# Patient Record
Sex: Female | Born: 1990
Health system: Southern US, Community
[De-identification: ages and names within clinical notes are randomized; demographics above are authoritative.]

## PROBLEM LIST (undated history)

## (undated) DIAGNOSIS — Z789 Other specified health status: Secondary | ICD-10-CM

## (undated) HISTORY — DX: Other specified health status: Z78.9

## (undated) HISTORY — PX: WISDOM TOOTH EXTRACTION: SHX21

## (undated) HISTORY — PX: NO PAST SURGERIES: SHX2092

---
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
## 2011-02-16 LAB — CBC
HCT: 29.7 % — ABNORMAL LOW (ref 36.0–46.0)
Hemoglobin: 10 g/dL — ABNORMAL LOW (ref 12.0–15.0)
MCHC: 33.6 g/dL (ref 30.0–36.0)
MCV: 88 fL (ref 78.0–100.0)
RDW: 13.7 % (ref 11.5–15.5)

## 2014-03-05 ENCOUNTER — Emergency Department (HOSPITAL_COMMUNITY)
Admission: EM | Admit: 2014-03-05 | Discharge: 2014-03-05 | Disposition: A | Discharge status: Home / Self Care | Payer: BC Managed Care – PPO | Source: Home / Self Care | Attending: Family Medicine | Admitting: Family Medicine

## 2014-03-05 ENCOUNTER — Encounter (HOSPITAL_COMMUNITY): Payer: Self-pay | Admitting: Emergency Medicine

## 2014-03-05 DIAGNOSIS — I498 Other specified cardiac arrhythmias: Secondary | ICD-10-CM

## 2014-03-05 DIAGNOSIS — R21 Rash and other nonspecific skin eruption: Secondary | ICD-10-CM

## 2014-03-05 DIAGNOSIS — I491 Atrial premature depolarization: Secondary | ICD-10-CM

## 2014-03-05 LAB — POCT I-STAT, CHEM 8
BUN: 8 mg/dL (ref 6–23)
CREATININE: 0.8 mg/dL (ref 0.50–1.10)
Calcium, Ion: 1.19 mmol/L (ref 1.12–1.23)
Chloride: 106 mEq/L (ref 96–112)
Glucose, Bld: 93 mg/dL (ref 70–99)
HEMATOCRIT: 44 % (ref 36.0–46.0)
Hemoglobin: 15 g/dL (ref 12.0–15.0)
Potassium: 3.9 mEq/L (ref 3.7–5.3)
SODIUM: 141 meq/L (ref 137–147)
TCO2: 23 mmol/L (ref 0–100)

## 2014-03-05 LAB — SEDIMENTATION RATE: SED RATE: 32 mm/h — AB (ref 0–22)

## 2014-03-05 LAB — RPR: RPR: NONREACTIVE

## 2014-03-05 LAB — HIV ANTIBODY (ROUTINE TESTING W REFLEX): HIV: NONREACTIVE

## 2014-03-05 MED ORDER — PREDNISONE 10 MG PO KIT: PACK | ORAL | Status: DC

## 2014-03-05 NOTE — Discharge Instructions (Signed)

## 2014-03-05 NOTE — ED Notes (Signed)
C/o rash on arms, legs, and chest x 3 weeks.  Rash comes and goes.  Irritation.  Pt has tried Claritin, benadryl with no relief.  Denies any changes in soaps,detergents, and no new medications.

## 2014-03-05 NOTE — ED Provider Notes (Signed)
CSN: [id number]     Arrival date & time 03/05/14  0802 History   First MD Initiated Contact with Patient 03/05/14 [phone]     Chief Complaint  Patient presents with  . Rash   (Consider location/radiation/quality/duration/timing/severity/associated sxs/prior Treatment) HPI Comments: 23 year old female presents complaining of a rash on her arms, chest, cheeks, lower back, and her palms intermittently for about 3 weeks. The rash comes and goes. It is itchy and irritated. Last night, she also started to have some very mild chest pressure. The pressure is worse with laying flat and gets better with activity. She also admits to some mild fatigue over the past couple of weeks. She has no other associated symptoms. She denies pain. Nonradiating. No significant personal or family medical history. She has no history of allergic reactions. Denies any changes in soaps, detergents, clothing. No new medications. No history of genital lesions. She was tested for HIV and syphilis about a year ago, this was negative. No history of tick bites. No fever. She has been taking Claritin and Benadryl with no relief.  Patient is a 23 y.o. female presenting with rash.  Rash Associated symptoms: fatigue   Associated symptoms: no abdominal pain, no fever, no joint pain, no myalgias, no nausea, no shortness of breath, no sore throat and not vomiting     History reviewed. No pertinent past medical history. History reviewed. No pertinent past surgical history. History reviewed. No pertinent family history. History  Substance Use Topics  . Smoking status: Never Smoker   . Smokeless tobacco: Not on file  . Alcohol Use: Yes   OB History   Grav Para Term Preterm Abortions TAB SAB Ect Mult Living                 Review of Systems  Constitutional: Positive for fatigue. Negative for fever and chills.  HENT: Negative for sinus pressure and sore throat.   Eyes: Negative for visual disturbance.  Respiratory: Positive for chest  tightness. Negative for cough and shortness of breath.   Cardiovascular: Negative for chest pain, palpitations and leg swelling.  Gastrointestinal: Negative for nausea, vomiting and abdominal pain.  Endocrine: Negative for polydipsia and polyuria.  Genitourinary: Negative for dysuria, urgency and frequency.  Musculoskeletal: Negative for arthralgias and myalgias.  Skin: Positive for rash.  Neurological: Negative for dizziness, weakness and light-headedness.    Allergies  Review of patient's allergies indicates no known allergies.  Home Medications   Current Outpatient Rx  Name  Route  Sig  Dispense  Refill  . PredniSONE 10 MG KIT      12 day taper dose pack. Use as directed   48 each   0    BP 135/79  Pulse 102  Temp(Src) 98.6 F (37 C) (Oral)  Resp 16  SpO2 99% Physical Exam  Nursing note and vitals reviewed. Constitutional: She is oriented to person, place, and time. Vital signs are normal. She appears well-developed and well-nourished. No distress.  HENT:  Head: Normocephalic and atraumatic.  Mouth/Throat: Oropharynx is clear and moist.  Eyes: Conjunctivae are normal. Right eye exhibits no discharge. Left eye exhibits no discharge.  Neck: Normal range of motion.  Cardiovascular: Normal rate, regular rhythm and normal heart sounds.  Exam reveals no gallop and no friction rub.   No murmur heard. Pulmonary/Chest: Effort normal and breath sounds normal. No respiratory distress. She has no wheezes. She has no rales.  Lymphadenopathy:       Head (right side): No submandibular and  no tonsillar adenopathy present.       Head (left side): No submandibular and no tonsillar adenopathy present.    She has no cervical adenopathy.    She has no axillary adenopathy.       Right: No supraclavicular adenopathy present.       Left: No supraclavicular adenopathy present.  Neurological: She is alert and oriented to person, place, and time. She has normal strength. Coordination normal.   Skin: Skin is warm and dry. Rash noted. Rash is maculopapular (erythematous, urticarial/maculopapular rash involving arms, palms, and upper chest ) and urticarial. She is not diaphoretic.  Psychiatric: She has a normal mood and affect. Judgment normal.    ED Course  Procedures (including critical care time) Labs Review Labs Reviewed  SEDIMENTATION RATE - Abnormal; Notable for the following:    Sed Rate 32 (*)    All other components within normal limits  RPR  HIV ANTIBODY (ROUTINE TESTING)  POCT I-STAT, CHEM 8   Imaging Review No results found.  EKG: p-wave inversion in II, III, aVF, indicated ectopic atrial rhythm   MDM   1. Rash   2. Ectopic atrial rhythm    Will treat rash with sterapred, continue antihistamine PRN.  Referred to derm.  Sent ESR, RPR, HIV, they are pending   Labs revealed only mild elevation in ESR, otherwise normal.  She will f/u with derm  Spoke with cardiologist Dr. Stanford Breed who also looked at patient's EKG, Dr. Stanford Breed interpreted this as ectopic atrial rhythm, nothing to worry about.  He did not recommend outpatient cardiology follow up.     Meds ordered this encounter  Medications  . PredniSONE 10 MG KIT    Sig: 12 day taper dose pack. Use as directed    Dispense:  48 each    Refill:  0    Order Specific Question:  Supervising Provider    Answer:  Ihor Gully D [place], PA-C 03/06/14 1939

## 2014-03-06 NOTE — ED Notes (Addendum)
Sed rate 32 H, HIV/RPR non-reactive.  Message sent to Z. Baker PA. [name], Lindsey Calderon 03/08/2014 He wrote essentially normal.  No further action needed. 03/09/2014

## 2014-03-08 NOTE — ED Provider Notes (Signed)
Medical screening examination/treatment/procedure(s) were performed by resident physician or non-physician practitioner and as supervising physician I was immediately available for consultation/collaboration.   Jaide Hillenburg DOUGLAS MD.   Olis Viverette D Kayra Crowell, MD 03/08/14 2118 

## 2014-04-09 ENCOUNTER — Ambulatory Visit: Payer: BC Managed Care – PPO | Admitting: Family Medicine

## 2014-04-27 ENCOUNTER — Encounter: Payer: Self-pay | Admitting: Family Medicine

## 2014-04-27 ENCOUNTER — Ambulatory Visit (INDEPENDENT_AMBULATORY_CARE_PROVIDER_SITE_OTHER): Payer: BC Managed Care – PPO | Admitting: Family Medicine

## 2014-04-27 VITALS — BP 122/62 | HR 84 | Temp 99.1°F | Wt 150.0 lb

## 2014-04-27 DIAGNOSIS — R21 Rash and other nonspecific skin eruption: Secondary | ICD-10-CM | POA: Insufficient documentation

## 2014-04-27 DIAGNOSIS — Z7189 Other specified counseling: Secondary | ICD-10-CM

## 2014-04-27 DIAGNOSIS — Z7689 Persons encountering health services in other specified circumstances: Secondary | ICD-10-CM

## 2014-04-27 NOTE — Progress Notes (Signed)
Pre visit review using our clinic review tool, if applicable. No additional management support is needed unless otherwise documented below in the visit note. 

## 2014-04-27 NOTE — Patient Instructions (Signed)
-  please let us know what your dermatologist tells you about your skin issues  -please schedule physical exam with pap at your convenience  -PLEASE SIGN UP FOR MYCHART TODAY   We recommend the following healthy lifestyle measures: - eat a healthy diet consisting of lots of vegetables, fruits, beans, nuts, seeds, healthy meats such as white chicken and fish and whole grains.  - avoid fried foods, fast food, processed foods, sodas, red meet and other fattening foods.  - get a least 150 minutes of aerobic exercise per week.   Follow up in: at your convenience - schedule [facility]/physical with [facility] or here

## 2014-04-27 NOTE — Progress Notes (Signed)
No chief complaint on file.   HPI:  Lindsey Calderon is here to establish care.  [facility] and [facility] - last pap in the last 3 years and normal per her report.  Has the following chronic problems and concerns today:  Patient Active Problem List   Diagnosis Date Noted  . Skin rash - followed by Dr. Dorita Sciara office 04/27/2014   Skin Issues: -followed by Dr. Dorita Sciara office - on doxycycline  Health Maintenance:  ROS: See pertinent positives and negatives per HPI.  History reviewed. No pertinent past medical history.  Family History  Problem Relation Age of Onset  . Hypertension Mother   . Thyroid disease Mother   . Cancer Mother     throat cancer  . Hypertension Father     History   Social History  . Marital Status: Single    Spouse Name: N/A    Number of Children: N/A  . Years of Education: N/A   Social History Main Topics  . Smoking status: Never Smoker   . Smokeless tobacco: None  . Alcohol Use: Yes     Comment: 1 drink occ  . Drug Use: No  . Sexual Activity: Yes    Birth Control/ Protection: Injection   Other Topics Concern  . None   Social History Narrative   Work or School: ER tech - [employer]      Home Situation: lives with boyfriend 42 yo son (2015)      Spiritual Beliefs: Christian      Lifestyle: walking 3 days per week; diet is fair             Current outpatient prescriptions:DOXYCYCLINE HYCLATE PO, Take 100 mg by mouth 2 (two) times daily., Disp: , Rfl:   EXAM:  Filed Vitals:   04/27/14 1107  BP: 122/62  Pulse: 103  Temp: 99.1 F (37.3 C)    There is no height on file to calculate BMI.  GENERAL: vitals reviewed and listed above, alert, oriented, appears well hydrated and in no acute distress  HEENT: atraumatic, conjunttiva clear, no obvious abnormalities on inspection of external nose and ears  NECK: no obvious masses on inspection  LUNGS: clear to auscultation bilaterally, no wheezes, rales or  rhonchi, good air movement  CV: HRRR, no peripheral edema  MS: moves all extremities without noticeable abnormality  PSYCH: pleasant and cooperative, no obvious depression or anxiety  ASSESSMENT AND PLAN:  Discussed the following assessment and plan:  Skin rash - followed by Dr. Dorita Sciara office  Encounter to establish care  -We reviewed the PMH, PSH, FH, SH, Meds and Allergies. -We provided refills for any medications we will prescribe as needed. -We addressed current concerns per orders and patient instructions. -We have asked for records for pertinent exams, studies, vaccines and notes from previous providers. -We have advised patient to follow up per instructions below. -she may [facility] here [facility] - she is going to decide   -Patient advised to return or notify a doctor immediately if symptoms worsen or persist or new concerns arise.  Patient Instructions  -please let us know what your dermatologist tells you about your skin issues  -please schedule physical exam with pap at your convenience  -PLEASE SIGN UP FOR MYCHART TODAY   We recommend the following healthy lifestyle measures: - eat a healthy diet consisting of lots of vegetables, fruits, beans, nuts, seeds, healthy meats such as white chicken and fish and whole  grains.  - avoid fried foods, fast food, processed foods, sodas, red meet and other fattening foods.  - get a least 150 minutes of aerobic exercise per week.   Follow up in: at your convenience - schedule pap/physical with [facility] or here        Terressa KoyanagiHannah R. Gedalya Jim

## 2014-04-30 ENCOUNTER — Ambulatory Visit: Payer: BC Managed Care – PPO | Admitting: Family Medicine

## 2016-07-31 ENCOUNTER — Encounter: Payer: BC Managed Care – PPO | Admitting: Family Medicine

## 2016-08-10 ENCOUNTER — Encounter: Payer: Self-pay | Admitting: Family Medicine

## 2016-08-10 ENCOUNTER — Other Ambulatory Visit (HOSPITAL_COMMUNITY)
Admission: RE | Admit: 2016-08-10 | Discharge: 2016-08-10 | Disposition: A | Discharge status: Home / Self Care | Payer: BC Managed Care – PPO | Source: Ambulatory Visit | Attending: Family Medicine | Admitting: Family Medicine

## 2016-08-10 ENCOUNTER — Ambulatory Visit (INDEPENDENT_AMBULATORY_CARE_PROVIDER_SITE_OTHER): Payer: BC Managed Care – PPO | Admitting: Family Medicine

## 2016-08-10 VITALS — BP 108/60 | HR 100 | Temp 99.6°F | Ht 64.5 in | Wt 163.0 lb

## 2016-08-10 DIAGNOSIS — Z01419 Encounter for gynecological examination (general) (routine) without abnormal findings: Secondary | ICD-10-CM | POA: Insufficient documentation

## 2016-08-10 DIAGNOSIS — Z23 Encounter for immunization: Secondary | ICD-10-CM

## 2016-08-10 DIAGNOSIS — Z111 Encounter for screening for respiratory tuberculosis: Secondary | ICD-10-CM | POA: Diagnosis not present

## 2016-08-10 DIAGNOSIS — Z0000 Encounter for general adult medical examination without abnormal findings: Secondary | ICD-10-CM | POA: Diagnosis not present

## 2016-08-10 DIAGNOSIS — Z113 Encounter for screening for infections with a predominantly sexual mode of transmission: Secondary | ICD-10-CM | POA: Diagnosis present

## 2016-08-10 DIAGNOSIS — N760 Acute vaginitis: Secondary | ICD-10-CM | POA: Insufficient documentation

## 2016-08-10 DIAGNOSIS — Z124 Encounter for screening for malignant neoplasm of cervix: Secondary | ICD-10-CM | POA: Diagnosis not present

## 2016-08-10 LAB — CBC
HCT: 36.4 % (ref 36.0–46.0)
HEMOGLOBIN: 12.5 g/dL (ref 12.0–15.0)
MCHC: 34.4 g/dL (ref 30.0–36.0)
MCV: 90.1 fl (ref 78.0–100.0)
PLATELETS: 212 10*3/uL (ref 150.0–400.0)
RBC: 4.04 Mil/uL (ref 3.87–5.11)
RDW: 13.9 % (ref 11.5–15.5)
WBC: 6.7 10*3/uL (ref 4.0–10.5)

## 2016-08-10 LAB — HEMOGLOBIN A1C: Hgb A1c MFr Bld: 5.7 % (ref 4.6–6.5)

## 2016-08-10 LAB — TSH: TSH: 0.71 u[IU]/mL (ref 0.35–4.50)

## 2016-08-10 NOTE — Progress Notes (Signed)
Pre visit review using our clinic review tool, if applicable. No additional management support is needed unless otherwise documented below in the visit note. 

## 2016-08-10 NOTE — Progress Notes (Signed)
HPI:  Here for CPE:  -Concerns and/or follow up today:  Requests completion form for [employer] info and Tech - no sports. Unfortunately mother passed several months ago after long battle with thyroid cancer. She feels she is coping ok now and does not feel needs help. Wants basic physical and labs due to this FH.  -Diet: variety of foods, balance and well rounded, larger portion sizes  -Exercise: no regular exercise  -Taking folic acid, vitamin D or calcium: yes  -Diabetes and Dyslipidemia Screening: not fasting.  -Hx of HTN: no  -Vaccines: UTD except flu and uncertain of last Tdap booster  -pap history: thinks last pap in 2010 and normal  -FDLMP: 07/09/16, reports periods irr since stopping depo  -sexual activity: yes, female partner, no new partners  -wants STI testing (Hep C if born [birth year]): want HIV and STI testing, though denies concerns or high risk behaviors  -FH breast, colon or ovarian ca: see FH Last mammogram: n/a Last colon cancer screening: n/a  -Alcohol, Tobacco, drug use: see social history  Review of Systems - no fevers, unintentional weight loss, vision loss, hearing loss, chest pain, sob, hemoptysis, melena, hematochezia, hematuria, genital discharge, changing or concerning skin lesions, bleeding, bruising, loc, thoughts of self harm or SI  No past medical history on file.  Past Surgical History:  Procedure Laterality Date  . WISDOM TOOTH EXTRACTION      Family History  Problem Relation Age of Onset  . Hypertension Mother   . Thyroid disease Mother   . Cancer Mother     throat cancer  . Hypertension Father     Social History   Social History  . Marital status: Single    Spouse name: N/A  . Number of children: N/A  . Years of education: N/A   Social History Main Topics  . Smoking status: Never Smoker  . Smokeless tobacco: None  . Alcohol use Yes     Comment: 1 drink occ  . Drug use: No  . Sexual activity: Yes    Birth control/  protection: Injection   Other Topics Concern  . None   Social History Narrative   Work or School: ER tech - [employer]      Home Situation: lives with boyfriend 693 yo son (2015)      Spiritual Beliefs: Christian      Lifestyle: walking 3 days per week; diet is fair             No current outpatient prescriptions on file.  EXAM:  Vitals:   08/10/16 1316  BP: 108/60  Pulse: 100  Temp: 99.6 F (37.6 C)  Body mass index is 27.55 kg/m.  GENERAL: vitals reviewed and listed below, alert, oriented, appears well hydrated and in no acute distress  HEENT: head atraumatic, PERRLA, normal appearance of eyes, ears, nose and mouth. moist mucus membranes.  NECK: supple, no masses or lymphadenopathy  LUNGS: clear to auscultation bilaterally, no rales, rhonchi or wheeze  CV: HRRR, no peripheral edema or cyanosis, normal pedal pulses  BREAST: normal appearance - no lesions or discharge, on palpation normal breast tissue without any suspicious masses  ABDOMEN: bowel sounds normal, soft, non tender to palpation, no masses, no rebound or guarding  GU: normal appearance of external genitalia - no lesions or masses, normal vaginal mucosa - no abnormal discharge, normal appearance of cervix - no lesions or abnormal discharge, no masses or tenderness on palpation of uterus and ovaries.  RECTAL: refused  SKIN:  no rash or abnormal lesions  MS: normal gait, moves all extremities normally  NEURO: normal gait, speech and thought processing grossly intact, muscle tone grossly intact throughout  PSYCH: normal affect, pleasant and cooperative  ASSESSMENT AND PLAN:  Discussed the following assessment and plan:  Encounter for preventive health examination - Plan: TSH, HIV antibody (with reflex), CBC (no diff), Hemoglobin A1c  Screening-pulmonary TB - Plan: Quantiferon tb gold assay (blood)  Cervical cancer screening - Plan: PAP [[place]]  Need for Tdap vaccination - Plan: Tdap vaccine  greater than or equal to 7yo IM  Encounter for immunization - Plan: Flu Vaccine QUAD 36+ mos IM   -Discussed and advised all [place] preventive services health task force level A and B recommendations for age, sex and risks.  -Advised at least 150 minutes of exercise per week and a healthy diet with avoidance of (less then 1 serving per week) processed foods, white starches, red meat, fast foods and sweets and consisting of: * 5-9 servings of fresh fruits and vegetables (not corn or potatoes) *nuts and seeds, beans *olives and olive oil *lean meats such as fish and white chicken  *whole grains  -labs, studies and vaccines per orders this encounter  -form given to assistant to finish once quant gold test back; advised assistant to contact patient when data complete.  Orders Placed This Encounter  Procedures  . Tdap vaccine greater than or equal to 7yo IM  . Flu Vaccine QUAD 36+ mos IM  . TSH  . HIV antibody (with reflex)  . CBC (no diff)  . Hemoglobin A1c  . Quantiferon tb gold assay (blood)    Patient advised to return to clinic immediately if symptoms worsen or persist or new concerns.  Patient Instructions  BEFORE YOU LEAVE: -follow up: yearly and as needed -flu and Tdap -hearing screen -lab  Once we get you tb blood test back, Ronnald Collum will complete the form and let you know that it is ready.  We have ordered labs or studies at this visit. It can take up to 1-2 weeks for results and processing. IF results require follow up or explanation, we will call you with instructions. Clinically stable results will be released to your [facility]. If you have not heard from [place] or cannot find your results in [facility] in 2 weeks please contact our office at [phone].  If you are not yet signed up for [facility], please consider signing up.   We recommend the following healthy lifestyle for LIFE: 1) Small portions.   Tip: eat off of a salad plate instead of a dinner plate.  Tip: It is ok to  feel hungry after a meal - that likely means you ate an appropriate portion.  Tip: if you need more or a snack choose fruits, veggies and/or a handful of nuts or seeds.  2) Eat a healthy clean diet.  * Tip: Avoid (less then 1 serving per week): processed foods, sweets, sweetened drinks, white starches (rice, flour, bread, potatoes, pasta, etc), red meat, fast foods, butter  *Tip: CHOOSE instead   * 5-9 servings per day of fresh or frozen fruits and vegetables (but not corn, potatoes, bananas, canned or dried fruit)   *nuts and seeds, beans   *olives and olive oil   *small portions of lean meats such as fish and white chicken    *small portions of whole grains  3)Get at least 150 minutes of sweaty aerobic exercise per week.  4)Reduce stress - consider  counseling, meditation and relaxation to balance other aspects of your life.            No Follow-up on file.  Kriste Basque R., DO

## 2016-08-10 NOTE — Patient Instructions (Signed)
BEFORE YOU LEAVE: -follow up: yearly and as needed -flu and Tdap -hearing screen -lab  Once we get you tb blood test back, Ronnald CollumJo Anne will complete the form and let you know that it is ready.  We have ordered labs or studies at this visit. It can take up to 1-2 weeks for results and processing. IF results require follow up or explanation, we will call you with instructions. Clinically stable results will be released to your [facility]. If you have not heard from us or cannot find your results in [facility] in 2 weeks please contact our office at [phone].  If you are not yet signed up for [facility], please consider signing up.   We recommend the following healthy lifestyle for LIFE: 1) Small portions.   Tip: eat off of a salad plate instead of a dinner plate.  Tip: It is ok to feel hungry after a meal - that likely means you ate an appropriate portion.  Tip: if you need more or a snack choose fruits, veggies and/or a handful of nuts or seeds.  2) Eat a healthy clean diet.  * Tip: Avoid (less then 1 serving per week): processed foods, sweets, sweetened drinks, white starches (rice, flour, bread, potatoes, pasta, etc), red meat, fast foods, butter  *Tip: CHOOSE instead   * 5-9 servings per day of fresh or frozen fruits and vegetables (but not corn, potatoes, bananas, canned or dried fruit)   *nuts and seeds, beans   *olives and olive oil   *small portions of lean meats such as fish and white chicken    *small portions of whole grains  3)Get at least 150 minutes of sweaty aerobic exercise per week.  4)Reduce stress - consider counseling, meditation and relaxation to balance other aspects of your life.

## 2016-08-11 LAB — HIV ANTIBODY (ROUTINE TESTING W REFLEX): HIV 1&2 Ab, 4th Generation: NONREACTIVE

## 2016-08-13 LAB — QUANTIFERON TB GOLD ASSAY (BLOOD)
Interferon Gamma Release Assay: NEGATIVE
Mitogen-Nil: >10 IU/mL
Quantiferon Nil Value: 0.04 IU/mL

## 2016-08-14 LAB — CYTOLOGY - PAP

## 2016-08-15 LAB — CERVICOVAGINAL ANCILLARY ONLY: Candida vaginitis: NEGATIVE

## 2016-08-16 MED ORDER — METRONIDAZOLE 500 MG PO TABS: 500.0000 mg | ORAL_TABLET | Freq: Two times a day (BID) | ORAL | 0 refills | Status: DC

## 2016-08-16 NOTE — Addendum Note (Signed)
Addended by: Johnella MoloneyFUNDERBURK, Jamisha Hoeschen A on: 08/16/2016 05:07 PM   Modules accepted: Orders

## 2017-08-22 ENCOUNTER — Encounter: Payer: Self-pay | Admitting: Family Medicine

## 2017-10-07 ENCOUNTER — Encounter: Payer: Self-pay | Admitting: Family Medicine

## 2017-10-07 NOTE — Progress Notes (Signed)
HPI:  Here for CPE:  -Concerns and/or follow up today:   R ear clogged, no pain, sinus congestion, fevers, drainage.  -Diet: variety of foods, balance and well rounded, larger portion sizes - sweets is her weakness  -Exercise: no regular exercise  -Taking folic acid, vitamin D or calcium: no  -Diabetes and Dyslipidemia Screening: fasting for labs  -Hx of HTN: no  -Vaccines: UTD  -pap history: 08/2016  -FDLMP: see nursing notes  -sexual activity: yes, female partner, no new partners  -wants STI testing (Hep C if born [birth year]): no  -FH breast, colon or ovarian ca: see FH Last mammogram:n/a Last colon cancer screening:n/a   -Alcohol, Tobacco, drug use: see social history  Review of Systems - no fevers, unintentional weight loss, vision loss, hearing loss, chest pain, sob, hemoptysis, melena, hematochezia, hematuria, genital discharge, changing or concerning skin lesions, bleeding, bruising, loc, thoughts of self harm or SI  No past medical history on file.  Past Surgical History:  Procedure Laterality Date  . WISDOM TOOTH EXTRACTION      Family History  Problem Relation Age of Onset  . Hypertension Mother   . Thyroid disease Mother   . Cancer Mother        throat cancer  . Hypertension Father     Social History   Socioeconomic History  . Marital status: Single    Spouse name: None  . Number of children: None  . Years of education: None  . Highest education level: None  Social Needs  . Financial resource strain: None  . Food insecurity - worry: None  . Food insecurity - inability: None  . Transportation needs - medical: None  . Transportation needs - non-medical: None  Occupational History  . None  Tobacco Use  . Smoking status: Never Smoker  . Smokeless tobacco: Never Used  Substance and Sexual Activity  . Alcohol use: Yes    Comment: 1 drink occ  . Drug use: No  . Sexual activity: Yes    Birth control/protection: Injection  Other Topics  Concern  . None  Social History Narrative   Work or School: [employer] Situation: lives with boyfriend 25 yo son (2015)      Spiritual Beliefs: Christian      Lifestyle: walking 3 days per week; diet is fair              Current Outpatient Medications:  .  metroNIDAZOLE (FLAGYL) 500 MG tablet, Take 1 tablet (500 mg total) by mouth 2 (two) times daily., Disp: 14 tablet, Rfl: 0  EXAM:  Vitals:   10/08/17 0756  BP: 108/70  Pulse: 94  Temp: 99.2 F (37.3 C)   Body mass index is 32.65 kg/m.  GENERAL: vitals reviewed and listed below, alert, oriented, appears well hydrated and in no acute distress  HEENT: head atraumatic, PERRLA, normal appearance of eyes, ears, nose and mouth. moist mucus membranes. Soft cerumen in R ear canal.  NECK: supple, no masses or lymphadenopathy  LUNGS: clear to auscultation bilaterally, no rales, rhonchi or wheeze  CV: HRRR, no peripheral edema or cyanosis, normal pedal pulses  ABDOMEN: bowel sounds normal, soft, non tender to palpation, no masses, no rebound or guarding  GU/BREAST: declined  SKIN: no rash or abnormal lesions  MS: normal gait, moves all extremities normally  NEURO: normal gait, speech and thought processing grossly intact, muscle tone grossly intact throughout  PSYCH: normal affect, pleasant and cooperative  ASSESSMENT AND PLAN:  Discussed the following assessment and plan:  Visit for preventive health examination  Screening for depression  BMI 32.0-32.9,adult - Plan: Hemoglobin A1c, Cholesterol, total, HDL cholesterol  Impacted cerumen of right ear   -Discussed and advised all [place] preventive services health task force level A and B recommendations for age, sex and risks.  -Advised at least 150 minutes of exercise per week and a healthy diet with avoidance of (less then 1 serving per week) processed foods, white starches, red meat, fast foods and sweets and consisting of: * 5-9 servings of  fresh fruits and vegetables (not corn or potatoes) *nuts and seeds, beans *olives and olive oil *lean meats such as fish and white chicken  *whole grains  -labs, studies and vaccines per orders this encounter  -removed cerumen for R ear canal with soft curette, tolerated well.  Orders Placed This Encounter  Procedures  . Hemoglobin A1c  . Cholesterol, total  . HDL cholesterol    Patient advised to return to clinic immediately if symptoms worsen or persist or new concerns.  Patient Instructions  BEFORE YOU LEAVE: -labs -follow up: yearly  We have ordered labs or studies at this visit. It can take up to 1-2 weeks for results and processing. IF results require follow up or explanation, we will call you with instructions. Clinically stable results will be released to your [facility]. If you have not heard from [place] or cannot find your results in [facility] in 2 weeks please contact our office at [phone].  If you are not yet signed up for [facility], please consider signing up.  Health Maintenance, Female Adopting a healthy lifestyle and getting preventive care can go a long way to promote health and wellness. Talk with your health care provider about what schedule of regular examinations is right for you. This is a good chance for you to check in with your provider about disease prevention and staying healthy. In between checkups, there are plenty of things you can do on your own. Experts have done a lot of research about which lifestyle changes and preventive measures are most likely to keep you healthy. Ask your health care provider for more information. Weight and diet Eat a healthy diet  Be sure to include plenty of vegetables, fruits, low-fat dairy products, and lean protein.  Do not eat a lot of foods high in solid fats, added sugars, or salt.  Get regular exercise. This is one of the most important things you can do for your health. ? Most adults should exercise for at least 150  minutes each week. The exercise should increase your heart rate and make you sweat (moderate-intensity exercise). ? Most adults should also do strengthening exercises at least twice a week. This is in addition to the moderate-intensity exercise.  Maintain a healthy weight  Body mass index (BMI) is a measurement that can be used to identify possible weight problems. It estimates body fat based on height and weight. Your health care provider can help determine your BMI and help you achieve or maintain a healthy weight.  For females 47 years of age and older: ? A BMI below 18.5 is considered underweight. ? A BMI of 18.5 to 24.9 is normal. ? A BMI of 25 to 29.9 is considered overweight. ? A BMI of 30 and above is considered obese.  Watch levels of cholesterol and blood lipids  You should start having your blood tested for lipids and cholesterol at 25 years of age, then  have this test every 5 years.  You may need to have your cholesterol levels checked more often if: ? Your lipid or cholesterol levels are high. ? You are older than 25 years of age. ? You are at high risk for heart disease.  Cancer screening Lung Cancer  Lung cancer screening is recommended for adults 61-9 years old who are at high risk for lung cancer because of a history of smoking.  A yearly low-dose CT scan of the lungs is recommended for people who: ? Currently smoke. ? Have quit within the past 15 years. ? Have at least a 30-pack-year history of smoking. A pack year is smoking an average of one pack of cigarettes a day for 1 year.  Yearly screening should continue until it has been 15 years since you quit.  Yearly screening should stop if you develop a health problem that would prevent you from having lung cancer treatment.  Breast Cancer  Practice breast self-awareness. This means understanding how your breasts normally appear and feel.  It also means doing regular breast self-exams. Let your health care  provider know about any changes, no matter how small.  If you are in your 20s or 30s, you should have a clinical breast exam (CBE) by a health care provider every 1-3 years as part of a regular health exam.  If you are 77 or older, have a CBE every year. Also consider having a breast X-ray (mammogram) every year.  If you have a family history of breast cancer, talk to your health care provider about genetic screening.  If you are at high risk for breast cancer, talk to your health care provider about having an MRI and a mammogram every year.  Breast cancer gene (BRCA) assessment is recommended for women who have family members with BRCA-related cancers. BRCA-related cancers include: ? Breast. ? Ovarian. ? Tubal. ? Peritoneal cancers.  Results of the assessment will determine the need for genetic counseling and BRCA1 and BRCA2 testing.  Cervical Cancer Your health care provider may recommend that you be screened regularly for cancer of the pelvic organs (ovaries, uterus, and vagina). This screening involves a pelvic examination, including checking for microscopic changes to the surface of your cervix (Pap test). You may be encouraged to have this screening done every 3 years, beginning at age 20.  For women ages 51-65, health care providers may recommend pelvic exams and Pap testing every 3 years, or they may recommend the Pap and pelvic exam, combined with testing for human papilloma virus (HPV), every 5 years. Some types of HPV increase your risk of cervical cancer. Testing for HPV may also be done on women of any age with unclear Pap test results.  Other health care providers may not recommend any screening for nonpregnant women who are considered low risk for pelvic cancer and who do not have symptoms. Ask your health care provider if a screening pelvic exam is right for you.  If you have had past treatment for cervical cancer or a condition that could lead to cancer, you need Pap tests  and screening for cancer for at least 20 years after your treatment. If Pap tests have been discontinued, your risk factors (such as having a new sexual partner) need to be reassessed to determine if screening should resume. Some women have medical problems that increase the chance of getting cervical cancer. In these cases, your health care provider may recommend more frequent screening and Pap tests.  Colorectal Cancer  This type of cancer can be detected and often prevented.  Routine colorectal cancer screening usually begins at 25 years of age and continues through 25 years of age.  Your health care provider may recommend screening at an earlier age if you have risk factors for colon cancer.  Your health care provider may also recommend using home test kits to check for hidden blood in the stool.  A small camera at the end of a tube can be used to examine your colon directly (sigmoidoscopy or colonoscopy). This is done to check for the earliest forms of colorectal cancer.  Routine screening usually begins at age 58.  Direct examination of the colon should be repeated every 5-10 years through 25 years of age. However, you may need to be screened more often if early forms of precancerous polyps or small growths are found.  Skin Cancer  Check your skin from head to toe regularly.  Tell your health care provider about any new moles or changes in moles, especially if there is a change in a mole's shape or color.  Also tell your health care provider if you have a mole that is larger than the size of a pencil eraser.  Always use sunscreen. Apply sunscreen liberally and repeatedly throughout the day.  Protect yourself by wearing long sleeves, pants, a wide-brimmed hat, and sunglasses whenever you are outside.  Heart disease, diabetes, and high blood pressure  High blood pressure causes heart disease and increases the risk of stroke. High blood pressure is more likely to develop  in: ? People who have blood pressure in the high end of the normal range (130-139/85-89 mm Hg). ? People who are overweight or obese. ? People who are African American.  If you are 57-77 years of age, have your blood pressure checked every 3-5 years. If you are 36 years of age or older, have your blood pressure checked every year. You should have your blood pressure measured twice-once when you are at a hospital or clinic, and once when you are not at a hospital or clinic. Record the average of the two measurements. To check your blood pressure when you are not at a hospital or clinic, you can use: ? An automated blood pressure machine at a pharmacy. ? A home blood pressure monitor.  If you are between 37 years and 59 years old, ask your health care provider if you should take aspirin to prevent strokes.  Have regular diabetes screenings. This involves taking a blood sample to check your fasting blood sugar level. ? If you are at a normal weight and have a low risk for diabetes, have this test once every three years after 25 years of age. ? If you are overweight and have a high risk for diabetes, consider being tested at a younger age or more often. Preventing infection Hepatitis B  If you have a higher risk for hepatitis B, you should be screened for this virus. You are considered at high risk for hepatitis B if: ? You were born in a country where hepatitis B is common. Ask your health care provider which countries are considered high risk. ? Your parents were born in a high-risk country, and you have not been immunized against hepatitis B (hepatitis B vaccine). ? You have HIV or AIDS. ? You use needles to inject street drugs. ? You live with someone who has hepatitis B. ? You have had sex with someone who has hepatitis B. ? You get hemodialysis treatment. ?  You take certain medicines for conditions, including cancer, organ transplantation, and autoimmune conditions.  Hepatitis C  Blood  testing is recommended for: ? Everyone born from [birth year] through 1964. ? Anyone with known risk factors for hepatitis C.  Sexually transmitted infections (STIs)  You should be screened for sexually transmitted infections (STIs) including gonorrhea and chlamydia if: ? You are sexually active and are younger than 25 years of age. ? You are older than 25 years of age and your health care provider tells you that you are at risk for this type of infection. ? Your sexual activity has changed since you were last screened and you are at an increased risk for chlamydia or gonorrhea. Ask your health care provider if you are at risk.  If you do not have HIV, but are at risk, it may be recommended that you take a prescription medicine daily to prevent HIV infection. This is called pre-exposure prophylaxis (PrEP). You are considered at risk if: ? You are sexually active and do not regularly use condoms or know the HIV status of your partner(s). ? You take drugs by injection. ? You are sexually active with a partner who has HIV.  Talk with your health care provider about whether you are at high risk of being infected with HIV. If you choose to begin PrEP, you should first be tested for HIV. You should then be tested every 3 months for as long as you are taking PrEP. Pregnancy  If you are premenopausal and you may become pregnant, ask your health care provider about preconception counseling.  If you may become pregnant, take 400 to 800 micrograms (mcg) of folic acid every day.  If you want to prevent pregnancy, talk to your health care provider about birth control (contraception). Osteoporosis and menopause  Osteoporosis is a disease in which the bones lose minerals and strength with aging. This can result in serious bone fractures. Your risk for osteoporosis can be identified using a bone density scan.  If you are 24 years of age or older, or if you are at risk for osteoporosis and fractures, ask your  health care provider if you should be screened.  Ask your health care provider whether you should take a calcium or vitamin D supplement to lower your risk for osteoporosis.  Menopause may have certain physical symptoms and risks.  Hormone replacement therapy may reduce some of these symptoms and risks. Talk to your health care provider about whether hormone replacement therapy is right for you. Follow these instructions at home:  Schedule regular health, dental, and eye exams.  Stay current with your immunizations.  Do not use any tobacco products including cigarettes, chewing tobacco, or electronic cigarettes.  If you are pregnant, do not drink alcohol.  If you are breastfeeding, limit how much and how often you drink alcohol.  Limit alcohol intake to no more than 1 drink per day for nonpregnant women. One drink equals 12 ounces of beer, 5 ounces of wine, or 1 ounces of hard liquor.  Do not use street drugs.  Do not share needles.  Ask your health care provider for help if you need support or information about quitting drugs.  Tell your health care provider if you often feel depressed.  Tell your health care provider if you have ever been abused or do not feel safe at home. This information is not intended to replace advice given to you by your health care provider. Make sure you discuss any questions you  have with your health care provider. Document Released: 06/04/2011 Document Revised: 04/26/2016 Document Reviewed: 08/23/2015 Elsevier Interactive Patient Education  2018 [employer].          No Follow-up on file.  Colin Benton R., DO

## 2017-10-08 ENCOUNTER — Ambulatory Visit (INDEPENDENT_AMBULATORY_CARE_PROVIDER_SITE_OTHER): Payer: BC Managed Care – PPO | Admitting: Family Medicine

## 2017-10-08 ENCOUNTER — Encounter: Payer: Self-pay | Admitting: Family Medicine

## 2017-10-08 VITALS — BP 108/70 | HR 94 | Temp 99.2°F | Ht 65.0 in | Wt 196.2 lb

## 2017-10-08 DIAGNOSIS — Z0000 Encounter for general adult medical examination without abnormal findings: Secondary | ICD-10-CM

## 2017-10-08 DIAGNOSIS — Z6832 Body mass index (BMI) 32.0-32.9, adult: Secondary | ICD-10-CM | POA: Diagnosis not present

## 2017-10-08 DIAGNOSIS — H6121 Impacted cerumen, right ear: Secondary | ICD-10-CM

## 2017-10-08 DIAGNOSIS — Z1331 Encounter for screening for depression: Secondary | ICD-10-CM

## 2017-10-08 LAB — CHOLESTEROL, TOTAL: CHOLESTEROL: 186 mg/dL (ref 0–200)

## 2017-10-08 LAB — HDL CHOLESTEROL: HDL: 58.2 mg/dL (ref >39.00)

## 2017-10-08 LAB — HEMOGLOBIN A1C: HEMOGLOBIN A1C: 5.8 % (ref 4.6–6.5)

## 2017-10-08 NOTE — Patient Instructions (Signed)
BEFORE YOU LEAVE: -labs -follow up: yearly  We have ordered labs or studies at this visit. It can take up to 1-2 weeks for results and processing. IF results require follow up or explanation, we will call you with instructions. Clinically stable results will be released to your [facility]. If you have not heard from [place] or cannot find your results in [facility] in 2 weeks please contact our office at [phone].  If you are not yet signed up for [facility], please consider signing up.  Health Maintenance, Female Adopting a healthy lifestyle and getting preventive care can go a long way to promote health and wellness. Talk with your health care provider about what schedule of regular examinations is right for you. This is a good chance for you to check in with your provider about disease prevention and staying healthy. In between checkups, there are plenty of things you can do on your own. Experts have done a lot of research about which lifestyle changes and preventive measures are most likely to keep you healthy. Ask your health care provider for more information. Weight and diet Eat a healthy diet  Be sure to include plenty of vegetables, fruits, low-fat dairy products, and lean protein.  Do not eat a lot of foods high in solid fats, added sugars, or salt.  Get regular exercise. This is one of the most important things you can do for your health. ? Most adults should exercise for at least 150 minutes each week. The exercise should increase your heart rate and make you sweat (moderate-intensity exercise). ? Most adults should also do strengthening exercises at least twice a week. This is in addition to the moderate-intensity exercise.  Maintain a healthy weight  Body mass index (BMI) is a measurement that can be used to identify possible weight problems. It estimates body fat based on height and weight. Your health care provider can help determine your BMI and help you achieve or maintain a healthy  weight.  For females 80 years of age and older: ? A BMI below 18.5 is considered underweight. ? A BMI of 18.5 to 24.9 is normal. ? A BMI of 25 to 29.9 is considered overweight. ? A BMI of 30 and above is considered obese.  Watch levels of cholesterol and blood lipids  You should start having your blood tested for lipids and cholesterol at 25 years of age, then have this test every 5 years.  You may need to have your cholesterol levels checked more often if: ? Your lipid or cholesterol levels are high. ? You are older than 25 years of age. ? You are at high risk for heart disease.  Cancer screening Lung Cancer  Lung cancer screening is recommended for adults 55-20 years old who are at high risk for lung cancer because of a history of smoking.  A yearly low-dose CT scan of the lungs is recommended for people who: ? Currently smoke. ? Have quit within the past 15 years. ? Have at least a 30-pack-year history of smoking. A pack year is smoking an average of one pack of cigarettes a day for 1 year.  Yearly screening should continue until it has been 15 years since you quit.  Yearly screening should stop if you develop a health problem that would prevent you from having lung cancer treatment.  Breast Cancer  Practice breast self-awareness. This means understanding how your breasts normally appear and feel.  It also means doing regular breast self-exams. Let your health care  provider know about any changes, no matter how small.  If you are in your 20s or 30s, you should have a clinical breast exam (CBE) by a health care provider every 1-3 years as part of a regular health exam.  If you are 44 or older, have a CBE every year. Also consider having a breast X-ray (mammogram) every year.  If you have a family history of breast cancer, talk to your health care provider about genetic screening.  If you are at high risk for breast cancer, talk to your health care provider about having an  MRI and a mammogram every year.  Breast cancer gene (BRCA) assessment is recommended for women who have family members with BRCA-related cancers. BRCA-related cancers include: ? Breast. ? Ovarian. ? Tubal. ? Peritoneal cancers.  Results of the assessment will determine the need for genetic counseling and BRCA1 and BRCA2 testing.  Cervical Cancer Your health care provider may recommend that you be screened regularly for cancer of the pelvic organs (ovaries, uterus, and vagina). This screening involves a pelvic examination, including checking for microscopic changes to the surface of your cervix (Pap test). You may be encouraged to have this screening done every 3 years, beginning at age 39.  For women ages 55-65, health care providers may recommend pelvic exams and Pap testing every 3 years, or they may recommend the Pap and pelvic exam, combined with testing for human papilloma virus (HPV), every 5 years. Some types of HPV increase your risk of cervical cancer. Testing for HPV may also be done on women of any age with unclear Pap test results.  Other health care providers may not recommend any screening for nonpregnant women who are considered low risk for pelvic cancer and who do not have symptoms. Ask your health care provider if a screening pelvic exam is right for you.  If you have had past treatment for cervical cancer or a condition that could lead to cancer, you need Pap tests and screening for cancer for at least 20 years after your treatment. If Pap tests have been discontinued, your risk factors (such as having a new sexual partner) need to be reassessed to determine if screening should resume. Some women have medical problems that increase the chance of getting cervical cancer. In these cases, your health care provider may recommend more frequent screening and Pap tests.  Colorectal Cancer  This type of cancer can be detected and often prevented.  Routine colorectal cancer screening  usually begins at 25 years of age and continues through 25 years of age.  Your health care provider may recommend screening at an earlier age if you have risk factors for colon cancer.  Your health care provider may also recommend using home test kits to check for hidden blood in the stool.  A small camera at the end of a tube can be used to examine your colon directly (sigmoidoscopy or colonoscopy). This is done to check for the earliest forms of colorectal cancer.  Routine screening usually begins at age 35.  Direct examination of the colon should be repeated every 5-10 years through 25 years of age. However, you may need to be screened more often if early forms of precancerous polyps or small growths are found.  Skin Cancer  Check your skin from head to toe regularly.  Tell your health care provider about any new moles or changes in moles, especially if there is a change in a mole's shape or color.  Also tell your  health care provider if you have a mole that is larger than the size of a pencil eraser.  Always use sunscreen. Apply sunscreen liberally and repeatedly throughout the day.  Protect yourself by wearing long sleeves, pants, a wide-brimmed hat, and sunglasses whenever you are outside.  Heart disease, diabetes, and high blood pressure  High blood pressure causes heart disease and increases the risk of stroke. High blood pressure is more likely to develop in: ? People who have blood pressure in the high end of the normal range (130-139/85-89 mm Hg). ? People who are overweight or obese. ? People who are African American.  If you are 6-77 years of age, have your blood pressure checked every 3-5 years. If you are 14 years of age or older, have your blood pressure checked every year. You should have your blood pressure measured twice-once when you are at a hospital or clinic, and once when you are not at a hospital or clinic. Record the average of the two measurements. To check  your blood pressure when you are not at a hospital or clinic, you can use: ? An automated blood pressure machine at a pharmacy. ? A home blood pressure monitor.  If you are between 35 years and 30 years old, ask your health care provider if you should take aspirin to prevent strokes.  Have regular diabetes screenings. This involves taking a blood sample to check your fasting blood sugar level. ? If you are at a normal weight and have a low risk for diabetes, have this test once every three years after 25 years of age. ? If you are overweight and have a high risk for diabetes, consider being tested at a younger age or more often. Preventing infection Hepatitis B  If you have a higher risk for hepatitis B, you should be screened for this virus. You are considered at high risk for hepatitis B if: ? You were born in a country where hepatitis B is common. Ask your health care provider which countries are considered high risk. ? Your parents were born in a high-risk country, and you have not been immunized against hepatitis B (hepatitis B vaccine). ? You have HIV or AIDS. ? You use needles to inject street drugs. ? You live with someone who has hepatitis B. ? You have had sex with someone who has hepatitis B. ? You get hemodialysis treatment. ? You take certain medicines for conditions, including cancer, organ transplantation, and autoimmune conditions.  Hepatitis C  Blood testing is recommended for: ? Everyone born from [birth year] through 1964. ? Anyone with known risk factors for hepatitis C.  Sexually transmitted infections (STIs)  You should be screened for sexually transmitted infections (STIs) including gonorrhea and chlamydia if: ? You are sexually active and are younger than 25 years of age. ? You are older than 25 years of age and your health care provider tells you that you are at risk for this type of infection. ? Your sexual activity has changed since you were last screened and you  are at an increased risk for chlamydia or gonorrhea. Ask your health care provider if you are at risk.  If you do not have HIV, but are at risk, it may be recommended that you take a prescription medicine daily to prevent HIV infection. This is called pre-exposure prophylaxis (PrEP). You are considered at risk if: ? You are sexually active and do not regularly use condoms or know the HIV status of your partner(s). ?  You take drugs by injection. ? You are sexually active with a partner who has HIV.  Talk with your health care provider about whether you are at high risk of being infected with HIV. If you choose to begin PrEP, you should first be tested for HIV. You should then be tested every 3 months for as long as you are taking PrEP. Pregnancy  If you are premenopausal and you may become pregnant, ask your health care provider about preconception counseling.  If you may become pregnant, take 400 to 800 micrograms (mcg) of folic acid every day.  If you want to prevent pregnancy, talk to your health care provider about birth control (contraception). Osteoporosis and menopause  Osteoporosis is a disease in which the bones lose minerals and strength with aging. This can result in serious bone fractures. Your risk for osteoporosis can be identified using a bone density scan.  If you are 35 years of age or older, or if you are at risk for osteoporosis and fractures, ask your health care provider if you should be screened.  Ask your health care provider whether you should take a calcium or vitamin D supplement to lower your risk for osteoporosis.  Menopause may have certain physical symptoms and risks.  Hormone replacement therapy may reduce some of these symptoms and risks. Talk to your health care provider about whether hormone replacement therapy is right for you. Follow these instructions at home:  Schedule regular health, dental, and eye exams.  Stay current with your  immunizations.  Do not use any tobacco products including cigarettes, chewing tobacco, or electronic cigarettes.  If you are pregnant, do not drink alcohol.  If you are breastfeeding, limit how much and how often you drink alcohol.  Limit alcohol intake to no more than 1 drink per day for nonpregnant women. One drink equals 12 ounces of beer, 5 ounces of wine, or 1 ounces of hard liquor.  Do not use street drugs.  Do not share needles.  Ask your health care provider for help if you need support or information about quitting drugs.  Tell your health care provider if you often feel depressed.  Tell your health care provider if you have ever been abused or do not feel safe at home. This information is not intended to replace advice given to you by your health care provider. Make sure you discuss any questions you have with your health care provider. Document Released: 06/04/2011 Document Revised: 04/26/2016 Document Reviewed: 08/23/2015 Elsevier Interactive Patient Education  [employer].

## 2017-12-12 ENCOUNTER — Encounter: Payer: Self-pay | Admitting: Family Medicine

## 2018-02-20 ENCOUNTER — Ambulatory Visit: Payer: BC Managed Care – PPO | Admitting: Family Medicine

## 2018-02-20 DIAGNOSIS — Z0289 Encounter for other administrative examinations: Secondary | ICD-10-CM

## 2018-02-24 ENCOUNTER — Ambulatory Visit (HOSPITAL_COMMUNITY)
Admission: EM | Admit: 2018-02-24 | Discharge: 2018-02-24 | Disposition: A | Discharge status: Home / Self Care | Payer: BC Managed Care – PPO | Attending: Family Medicine | Admitting: Family Medicine

## 2018-02-24 ENCOUNTER — Encounter (HOSPITAL_COMMUNITY): Payer: Self-pay | Admitting: Family Medicine

## 2018-02-24 DIAGNOSIS — L739 Follicular disorder, unspecified: Secondary | ICD-10-CM

## 2018-02-24 MED ORDER — CEPHALEXIN 250 MG/5ML PO SUSR: 500.0000 mg | Freq: Three times a day (TID) | ORAL | 0 refills | Status: AC

## 2018-02-24 NOTE — Discharge Instructions (Signed)
You may take ibuprofen 600mg  with food every 6 hours for the inflammation.

## 2018-02-24 NOTE — ED Triage Notes (Signed)
Pt here for abscess in left axilla x 1 week. Denies drainage, fever.

## 2018-02-24 NOTE — ED Provider Notes (Signed)
  MRN: [id number] DOB: 24-Nov-1991  Subjective:   Lindsey Calderon is a 27 y.o. female presenting for 1 week history of bump over her left arm. Patient had 2 others but have resolved with warm compresses. She denies fever, drainage of pus or bleeding, redness, pain, itching. Patient does shave her underarms.   Lindsey Calderon is not currently taking any medications and has No Known Allergies. Denies past medical history.  Past Surgical History:  Procedure Laterality Date  . WISDOM TOOTH EXTRACTION      Objective:   Vitals: BP 131/88   Pulse 97   Temp 99.4 F (37.4 C) (Oral)   Resp 18   SpO2 100%   Physical Exam  Constitutional: She is oriented to person, place, and time. She appears well-developed and well-nourished.  Cardiovascular: Normal rate.  Pulmonary/Chest: Effort normal.  Neurological: She is alert and oriented to person, place, and time.  Skin: Skin is warm and dry.     Psychiatric: She has a normal mood and affect.   Assessment and Plan :   Folliculitis of left axilla  Will have patient continue warm compresses, start Keflex. Use ibuprofen for inflammation. Return-to-clinic precautions discussed, patient verbalized understanding.    Wallis BambergMani, Katiria Calame, PA-C 02/24/18 1344

## 2018-03-06 ENCOUNTER — Ambulatory Visit: Payer: BC Managed Care – PPO | Admitting: Family Medicine

## 2018-12-03 NOTE — L&D Delivery Note (Addendum)
OB/GYN Faculty Practice Delivery Note  [name] is a 28 y.o. G2P1001 s/p SVD at [redacted]w[redacted]d. She was admitted for PROM.   ROM: 8h 84m with clear fluid GBS Status:  --/Positive (11/11 1350), received 1 dose PenG Maximum Maternal Temperature: 98.5   Labor Progress: . Patient arrived at 5 cm dilation w/ SROM at home and was augmented with pitocin.   Delivery Date/Time: 11/08/2019 at 1512 Delivery: Called to room and patient was complete and pushing. Head delivered in ROA position. Shoulder and body delivered in usual fashion. Tight R ankle cord present upon delivery, reduced.  Infant with spontaneous cry, placed on mother's abdomen, dried and stimulated. Cord clamped x 2 after 1-minute delay, and cut by FOB. Cord blood drawn. Placenta delivered spontaneously with gentle cord traction. Fundus firm with massage and Pitocin. Labia, perineum, vagina, and cervix inspected with bil periurethral laceration which were hemostatic and did not require repair.   Placenta: spontaneous, intact, 3 vessel cord Complications: none Lacerations: bil periurethral laceration, hemostatic EBL: 150cc Analgesia: none   Infant: APGAR (1 MIN): 9   APGAR (5 MINS): 10    Weight: pending  Demetrius Revel, MD PGY-3  Patient is a 28 y.o. at [redacted]w[redacted]d who was admitted SROM, significant hx of previous third degree laceration.  I was gloved and present for delivery in its entirety.  Second stage of labor progressed, baby delivered after 3 contractions.   Complications: cord around foot x1  Wende Mott, CNM 5:17 PM

## 2019-02-11 NOTE — Progress Notes (Signed)
HPI:  Using dictation device. Unfortunately this device frequently misinterprets words/phrases.  Was scheduled as a physical, but is sick today and did not come fasting. She has had nasal congestion, fever, body aches and cough for about 4 days. Feels much better the last few days. Was around a number of children whom had a cold. No SOB, travel hx, exposure to PUI for COVID 19. She recently got married. She has been off birth control for about 6 months and has noticed she has more menstrual cramping since coming off birth control. No irr periods, heavy bleeding, intramenstrual spotting. She asks my recs for an ob/gyn office. She would like to do lipid and diabetes testing. Could improve in terms of diet and exercise and plans to work on [employer].   History reviewed. No pertinent past medical history.  Past Surgical History:  Procedure Laterality Date  . WISDOM TOOTH EXTRACTION      Family History  Problem Relation Age of Onset  . Hypertension Mother   . Thyroid disease Mother   . Cancer Mother        throat cancer  . Hypertension Father     Social History   Socioeconomic History  . Marital status: Married    Spouse name: Not on file  . Number of children: Not on file  . Years of education: Not on file  . Highest education level: Not on file  Occupational History  . Not on file  Social Needs  . Financial resource strain: Not on file  . Food insecurity:    Worry: Not on file    Inability: Not on file  . Transportation needs:    Medical: Not on file    Non-medical: Not on file  Tobacco Use  . Smoking status: Never Smoker  . Smokeless tobacco: Never Used  Substance and Sexual Activity  . Alcohol use: Yes    Comment: 1 drink occ  . Drug use: No  . Sexual activity: Yes    Birth control/protection: Injection  Lifestyle  . Physical activity:    Days per week: Not on file    Minutes per session: Not on file  . Stress: Not on file  Relationships  . Social connections:    Talks on phone: Not on file    Gets together: Not on file    Attends religious service: Not on file    Active member of club or organization: Not on file    Attends meetings of clubs or organizations: Not on file    Relationship status: Not on file  Other Topics Concern  . Not on file  Social History Narrative   Work or School: ER tech - [employer]      Home Situation: lives with boyfriend 62 yo son (2015)      Spiritual Beliefs: Christian      Lifestyle: walking 3 days per week; diet is fair             No current outpatient medications on file.  EXAM:  Vitals:   02/12/19 1420  BP: 110/60  Pulse: 90  Temp: 99.4 F (37.4 C)  SpO2: 98%   Body mass index is 31.02 kg/m.  GENERAL: vitals reviewed and listed below, alert, oriented, appears well hydrated and in no acute distress  HEENT: head atraumatic, PERRLA, normal appearance of eyes, ears, nose and mouth. moist mucus membranes.  NECK: supple, no masses or lymphadenopathy  LUNGS: clear to auscultation bilaterally, no rales, rhonchi or wheeze  CV: HRRR,  no peripheral edema or cyanosis, normal pedal pulses  ABDOMEN: bowel sounds normal, soft, non tender to palpation, no masses, no rebound or guarding  MS: normal gait, moves all extremities normally  NEURO: normal gait, speech and thought processing grossly intact, muscle tone grossly intact throughout  PSYCH: normal affect, pleasant and cooperative  ASSESSMENT AND PLAN:  Discussed the following assessment and plan:  1. Hyperglycemia - Hemoglobin A1c  2. BMI 31.0-31.9,adult - Lipid panel -lifestyle recs  3. Viral upper respiratory illness -feels is recovering well, offered to screen for influenza, she declined as feels better. Advised follow up if worsening or new concerns or symptoms not completely resolving as expected.  4. Dysmenorrhea -advised gyn evaluation and can do her pap smear then as well, she plans to contact gyn. Recommended [facility],  [facility] or [facility].   Patient advised to return to clinic immediately if symptoms worsen or persist or new concerns.  Patient Instructions  BEFORE YOU LEAVE: -follow up: set up fasting lab visit in 1 - 2 weeks   Call [facility] for evaluation and your next pap smear.  I hope the cough and respiratory illness continue to improve. Please seek care if worsening or concerns.  We have ordered labs or studies at this visit. It can take up to 1-2 weeks for results and processing. IF results require follow up or explanation, we will call you with instructions. Clinically stable results will be released to your [facility]. If you have not heard from [place] or cannot find your results in [facility] in 2 weeks please contact our office at [phone].  If you are not yet signed up for [facility], please consider signing up.   We recommend the following healthy lifestyle for LIFE: 1) Small portions. But, make sure to get regular (at least 3 per day), healthy meals and small healthy snacks if needed.  2) Eat a healthy clean diet.   TRY TO EAT: -at least 5-7 servings of low sugar, colorful, and nutrient rich vegetables per day (not corn, potatoes or bananas.) -berries are the best choice if you wish to eat fruit (only eat small amounts if trying to reduce weight)  -lean meets (fish, white meat of chicken or [place]) -vegan proteins for some meals - beans or tofu, whole grains, nuts and seeds -Replace bad fats with good fats - good fats include: fish, nuts and seeds, canola oil, olive oil -small amounts of low fat or non fat dairy -small amounts of100 % whole grains - check the lables -drink plenty of water  AVOID: -SUGAR, sweets, anything with added sugar, corn syrup or sweeteners - must read labels as even foods advertised as "healthy" often are loaded with sugar -if you must have a sweetener, small amounts of stevia may be best -sweetened beverages and artificially sweetened beverages -simple  starches (rice, bread, potatoes, pasta, chips, etc - small amounts of 100% whole grains are ok) -red meat, pork, butter -fried foods, fast food, processed food, excessive dairy, eggs and coconut.  3)Get at least 150 minutes of sweaty aerobic exercise per week.  4)Reduce stress - consider counseling, meditation and relaxation to balance other aspects of your life.         No follow-ups on file.  Terressa Koyanagi, DO

## 2019-02-12 ENCOUNTER — Encounter: Payer: Self-pay | Admitting: Family Medicine

## 2019-02-12 ENCOUNTER — Other Ambulatory Visit: Payer: Self-pay

## 2019-02-12 ENCOUNTER — Ambulatory Visit (INDEPENDENT_AMBULATORY_CARE_PROVIDER_SITE_OTHER): Payer: No Typology Code available for payment source | Admitting: Family Medicine

## 2019-02-12 VITALS — BP 110/60 | HR 90 | Temp 99.4°F | Ht 66.0 in | Wt 192.2 lb

## 2019-02-12 DIAGNOSIS — N946 Dysmenorrhea, unspecified: Secondary | ICD-10-CM | POA: Diagnosis not present

## 2019-02-12 DIAGNOSIS — R739 Hyperglycemia, unspecified: Secondary | ICD-10-CM | POA: Diagnosis not present

## 2019-02-12 DIAGNOSIS — Z6831 Body mass index (BMI) 31.0-31.9, adult: Secondary | ICD-10-CM

## 2019-02-12 DIAGNOSIS — J069 Acute upper respiratory infection, unspecified: Secondary | ICD-10-CM

## 2019-02-12 NOTE — Patient Instructions (Signed)
BEFORE YOU LEAVE: -follow up: set up fasting lab visit in 1 - 2 weeks   Call [facility] for evaluation and your next pap smear.  I hope the cough and respiratory illness continue to improve. Please seek care if worsening or concerns.  We have ordered labs or studies at this visit. It can take up to 1-2 weeks for results and processing. IF results require follow up or explanation, we will call you with instructions. Clinically stable results will be released to your [facility]. If you have not heard from [place] or cannot find your results in [facility] in 2 weeks please contact our office at [phone].  If you are not yet signed up for [facility], please consider signing up.   We recommend the following healthy lifestyle for LIFE: 1) Small portions. But, make sure to get regular (at least 3 per day), healthy meals and small healthy snacks if needed.  2) Eat a healthy clean diet.   TRY TO EAT: -at least 5-7 servings of low sugar, colorful, and nutrient rich vegetables per day (not corn, potatoes or bananas.) -berries are the best choice if you wish to eat fruit (only eat small amounts if trying to reduce weight)  -lean meets (fish, white meat of chicken or [place]) -vegan proteins for some meals - beans or tofu, whole grains, nuts and seeds -Replace bad fats with good fats - good fats include: fish, nuts and seeds, canola oil, olive oil -small amounts of low fat or non fat dairy -small amounts of100 % whole grains - check the lables -drink plenty of water  AVOID: -SUGAR, sweets, anything with added sugar, corn syrup or sweeteners - must read labels as even foods advertised as "healthy" often are loaded with sugar -if you must have a sweetener, small amounts of stevia may be best -sweetened beverages and artificially sweetened beverages -simple starches (rice, bread, potatoes, pasta, chips, etc - small amounts of 100% whole grains are ok) -red meat, pork, butter -fried foods, fast food,  processed food, excessive dairy, eggs and coconut.  3)Get at least 150 minutes of sweaty aerobic exercise per week.  4)Reduce stress - consider counseling, meditation and relaxation to balance other aspects of your life.

## 2019-05-04 ENCOUNTER — Encounter: Payer: Self-pay | Admitting: Obstetrics & Gynecology

## 2019-05-04 ENCOUNTER — Ambulatory Visit (INDEPENDENT_AMBULATORY_CARE_PROVIDER_SITE_OTHER): Payer: No Typology Code available for payment source | Admitting: Obstetrics & Gynecology

## 2019-05-04 ENCOUNTER — Other Ambulatory Visit: Payer: Self-pay

## 2019-05-04 DIAGNOSIS — Z113 Encounter for screening for infections with a predominantly sexual mode of transmission: Secondary | ICD-10-CM | POA: Diagnosis not present

## 2019-05-04 DIAGNOSIS — Z3490 Encounter for supervision of normal pregnancy, unspecified, unspecified trimester: Secondary | ICD-10-CM | POA: Insufficient documentation

## 2019-05-04 DIAGNOSIS — Z3481 Encounter for supervision of other normal pregnancy, first trimester: Secondary | ICD-10-CM

## 2019-05-04 DIAGNOSIS — Z124 Encounter for screening for malignant neoplasm of cervix: Secondary | ICD-10-CM

## 2019-05-04 DIAGNOSIS — O99210 Obesity complicating pregnancy, unspecified trimester: Secondary | ICD-10-CM | POA: Insufficient documentation

## 2019-05-04 DIAGNOSIS — Z3201 Encounter for pregnancy test, result positive: Secondary | ICD-10-CM | POA: Diagnosis not present

## 2019-05-04 DIAGNOSIS — Z3A13 13 weeks gestation of pregnancy: Secondary | ICD-10-CM

## 2019-05-04 LAB — POCT URINE PREGNANCY: Preg Test, Ur: POSITIVE — AB

## 2019-05-04 NOTE — Progress Notes (Signed)
  Subjective:    Mekayla Overmiller is being seen today for her first obstetrical visit.  This is a planned pregnancy. She is at [redacted]w[redacted]d gestation. Her obstetrical history is significant for obesity. Relationship with FOB: spouse, living together. Patient does intend to breast feed. Pregnancy history fully reviewed.  Patient reports no complaints.  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 Objective:     BP 115/75   Pulse (!) 120   Wt 184 lb (83.5 kg)   LMP 02/02/2019 (Approximate)   BMI 29.70 kg/m  Physical Exam  Exam Breathing, conversing, and ambulating normally Well nourished, well hydrated Black female, no apparent distress Heart- rrr Lungs- CTAB Abd- benign Speculum exam- normal, pap obtained   Assessment:    Pregnancy: G2P1001 Patient Active Problem List   Diagnosis Date Noted  . Encounter for supervision of other normal pregnancy, first trimester 05/04/2019  . Obesity in pregnancy 05/04/2019  . Skin rash - followed by Dr. Dorita Sciara office 04/27/2014       Plan:     Initial labs drawn. Prenatal vitamins. Problem list reviewed and updated. AFP3 discussed: undecided. Role of ultrasound in pregnancy discussed; fetal survey: ordered. Amniocentesis discussed: not indicated. Pap smear obtained. Discussed weight gain Baby scripts Next visit at 20 weeks She will call her insurance about NIPS and will call and come by for blood draw for this prn  Allie Bossier 05/04/2019

## 2019-05-05 LAB — OBSTETRIC PANEL, INCLUDING HIV
Antibody Screen: NEGATIVE
Basophils Absolute: 0 10*3/uL (ref 0.0–0.2)
Basos: 0 %
EOS (ABSOLUTE): 0.3 10*3/uL (ref 0.0–0.4)
Eos: 3 %
HIV Screen 4th Generation wRfx: NONREACTIVE
Hematocrit: 37.1 % (ref 34.0–46.6)
Hemoglobin: 12.6 g/dL (ref 11.1–15.9)
Hepatitis B Surface Ag: NEGATIVE
Immature Grans (Abs): 0 10*3/uL (ref 0.0–0.1)
Immature Granulocytes: 0 %
Lymphocytes Absolute: 1.4 10*3/uL (ref 0.7–3.1)
Lymphs: 18 %
MCH: 30.1 pg (ref 26.6–33.0)
MCHC: 34 g/dL (ref 31.5–35.7)
MCV: 89 fL (ref 79–97)
Monocytes Absolute: 0.6 10*3/uL (ref 0.1–0.9)
Monocytes: 8 %
Neutrophils Absolute: 5.7 10*3/uL (ref 1.4–7.0)
Neutrophils: 71 %
Platelets: 249 10*3/uL (ref 150–450)
RBC: 4.19 x10E6/uL (ref 3.77–5.28)
RDW: 13.1 % (ref 11.7–15.4)
RPR Ser Ql: NONREACTIVE
Rh Factor: POSITIVE
Rubella Antibodies, IGG: <0.9 index — ABNORMAL LOW (ref >0.99)
WBC: 8.1 10*3/uL (ref 3.4–10.8)

## 2019-05-05 LAB — COMPREHENSIVE METABOLIC PANEL
ALT: 9 IU/L (ref 0–32)
AST: 12 IU/L (ref 0–40)
Albumin/Globulin Ratio: 1.8 (ref 1.2–2.2)
Albumin: 4.5 g/dL (ref 3.9–5.0)
Alkaline Phosphatase: 59 IU/L (ref 39–117)
BUN/Creatinine Ratio: 11 (ref 9–23)
BUN: 7 mg/dL (ref 6–20)
Bilirubin Total: 0.3 mg/dL (ref 0.0–1.2)
CO2: 19 mmol/L — ABNORMAL LOW (ref 20–29)
Calcium: 9.8 mg/dL (ref 8.7–10.2)
Chloride: 103 mmol/L (ref 96–106)
Creatinine, Ser: 0.66 mg/dL (ref 0.57–1.00)
GFR calc Af Amer: 140 mL/min/{1.73_m2} (ref >59)
GFR calc non Af Amer: 121 mL/min/{1.73_m2} (ref >59)
Globulin, Total: 2.5 g/dL (ref 1.5–4.5)
Glucose: 83 mg/dL (ref 65–99)
Potassium: 4.1 mmol/L (ref 3.5–5.2)
Sodium: 140 mmol/L (ref 134–144)
Total Protein: 7 g/dL (ref 6.0–8.5)

## 2019-05-05 LAB — HEMOGLOBIN A1C
Est. average glucose Bld gHb Est-mCnc: 108 mg/dL
Hgb A1c MFr Bld: 5.4 % (ref 4.8–5.6)

## 2019-05-05 LAB — PROTEIN / CREATININE RATIO, URINE
Creatinine, Urine: 348.8 mg/dL
Protein, Ur: 39.4 mg/dL
Protein/Creat Ratio: 113 mg/g creat (ref 0–200)

## 2019-05-06 ENCOUNTER — Encounter: Payer: Self-pay | Admitting: Obstetrics & Gynecology

## 2019-05-06 DIAGNOSIS — Z283 Underimmunization status: Secondary | ICD-10-CM | POA: Insufficient documentation

## 2019-05-06 DIAGNOSIS — Z2839 Other underimmunization status: Secondary | ICD-10-CM | POA: Insufficient documentation

## 2019-05-07 ENCOUNTER — Other Ambulatory Visit: Payer: Self-pay | Admitting: Obstetrics & Gynecology

## 2019-05-07 LAB — CULTURE, OB URINE

## 2019-05-07 LAB — CYTOLOGY - PAP
Chlamydia: NEGATIVE
Diagnosis: NEGATIVE
Neisseria Gonorrhea: NEGATIVE

## 2019-05-07 LAB — URINE CULTURE, OB REFLEX

## 2019-05-07 MED ORDER — NITROFURANTOIN MONOHYD MACRO 100 MG PO CAPS: 100.0000 mg | ORAL_CAPSULE | Freq: Two times a day (BID) | ORAL | 1 refills | Status: DC

## 2019-05-07 NOTE — Progress Notes (Signed)
macrobid prescribed for Ecoli UTI 

## 2019-05-13 ENCOUNTER — Other Ambulatory Visit: Payer: Self-pay | Admitting: Obstetrics & Gynecology

## 2019-05-15 ENCOUNTER — Other Ambulatory Visit: Payer: Self-pay | Admitting: Obstetrics & Gynecology

## 2019-05-15 MED ORDER — FOSFOMYCIN TROMETHAMINE 3 G PO PACK: 3.0000 g | PACK | Freq: Once | ORAL | 0 refills | Status: AC

## 2019-05-15 NOTE — Progress Notes (Unsigned)
She cannot swallow pills and wants something that can be crushed. phosphomycin prescribed

## 2019-06-15 ENCOUNTER — Other Ambulatory Visit: Payer: Self-pay

## 2019-06-15 ENCOUNTER — Ambulatory Visit (HOSPITAL_COMMUNITY)
Admission: RE | Admit: 2019-06-15 | Discharge: 2019-06-15 | Disposition: A | Discharge status: Home / Self Care | Payer: No Typology Code available for payment source | Source: Ambulatory Visit | Attending: Obstetrics and Gynecology | Admitting: Obstetrics and Gynecology

## 2019-06-15 ENCOUNTER — Other Ambulatory Visit: Payer: Self-pay | Admitting: Obstetrics & Gynecology

## 2019-06-15 DIAGNOSIS — O358XX0 Maternal care for other (suspected) fetal abnormality and damage, not applicable or unspecified: Secondary | ICD-10-CM

## 2019-06-15 DIAGNOSIS — Z3A19 19 weeks gestation of pregnancy: Secondary | ICD-10-CM

## 2019-06-15 DIAGNOSIS — Z3481 Encounter for supervision of other normal pregnancy, first trimester: Secondary | ICD-10-CM | POA: Insufficient documentation

## 2019-06-16 ENCOUNTER — Other Ambulatory Visit (HOSPITAL_COMMUNITY): Payer: Self-pay | Admitting: *Deleted

## 2019-06-16 DIAGNOSIS — Z362 Encounter for other antenatal screening follow-up: Secondary | ICD-10-CM

## 2019-06-22 ENCOUNTER — Encounter: Payer: Self-pay | Admitting: Obstetrics & Gynecology

## 2019-06-22 ENCOUNTER — Other Ambulatory Visit: Payer: Self-pay

## 2019-06-22 ENCOUNTER — Telehealth (INDEPENDENT_AMBULATORY_CARE_PROVIDER_SITE_OTHER): Payer: No Typology Code available for payment source | Admitting: Obstetrics & Gynecology

## 2019-06-22 ENCOUNTER — Encounter: Payer: No Typology Code available for payment source | Admitting: Obstetrics & Gynecology

## 2019-06-22 VITALS — BP 131/81 | Wt 180.0 lb

## 2019-06-22 DIAGNOSIS — O283 Abnormal ultrasonic finding on antenatal screening of mother: Secondary | ICD-10-CM

## 2019-06-22 DIAGNOSIS — Z3481 Encounter for supervision of other normal pregnancy, first trimester: Secondary | ICD-10-CM

## 2019-06-22 DIAGNOSIS — Z3A20 20 weeks gestation of pregnancy: Secondary | ICD-10-CM

## 2019-06-22 NOTE — Patient Instructions (Signed)
Return to office for any scheduled appointments. Call the office or go to the MAU at [facility] if:  You begin to have strong, frequent contractions  Your water breaks.  Sometimes it is a big gush of fluid, sometimes it is just a trickle that keeps getting your panties wet or running down your legs  You have vaginal bleeding.  It is normal to have a small amount of spotting if your cervix was checked.   You do not feel your baby moving like normal.  If you do not, get something to eat and drink and lay down and focus on feeling your baby move.   If your baby is still not moving like normal, you should call the office or go to MAU.  Any other obstetric concerns.   

## 2019-06-22 NOTE — Progress Notes (Signed)
I connected with  Lindsey Calderon on 06/22/19 at  1:45 PM EDT by telephone and verified that I am speaking with the correct person using two identifiers.   I discussed the limitations, risks, security and privacy concerns of performing an evaluation and management service by telephone and the availability of in person appointments. I also discussed with the patient that there may be a patient responsible charge related to this service. The patient expressed understanding and agreed to proceed.  Rosilyn Coachman Jeanella Anton, CMA 06/22/2019  1:45 PM

## 2019-06-22 NOTE — Progress Notes (Signed)
TELEHEALTH OBSTETRICS PRENATAL VIRTUAL VIDEO VISIT ENCOUNTER NOTE  [facility]   I connected with Lindsey KerbsJoanna Rubalcava on 06/22/19 at  1:45 PM EDT by MyChart Video Encounter at home and verified that I am speaking with the correct person using two identifiers.   I discussed the limitations, risks, security and privacy concerns of performing an evaluation and management service virtually and the availability of in person appointments. I also discussed with the patient that there may be a patient responsible charge related to this service. The patient expressed understanding and agreed to proceed. Subjective:  Lindsey Calderon is a 28 y.o. G2P1001 at 3116w0d being seen today for ongoing prenatal care.  She is currently monitored for the following issues for this low-risk pregnancy and has Encounter for supervision of other normal pregnancy, first trimester; Obesity in pregnancy; Rubella non-immune status, antepartum; and Echogenic intracardiac focus of fetus on prenatal ultrasound on their problem list.  Patient reports no complaints.  Contractions: Not present. Vag. Bleeding: None.   . Denies any leaking of fluid.   The following portions of the patient's history were reviewed and updated as appropriate: allergies, current medications, past family history, past medical history, past social history, past surgical history and problem list.   Objective:   Vitals:   06/22/19 1344  BP: 131/81  Weight: 180 lb (81.6 kg)    Fetal Status:           General:  Alert, oriented and cooperative. Patient is in no acute distress.  Respiratory: Normal respiratory effort, no problems with respiration noted  Mental Status: Normal mood and affect. Normal behavior. Normal judgment and thought content.  Rest of physical exam deferred due to type of encounter  Imaging: [place] Mfm Ob Detail +14 Wk  Result Date: 06/15/2019  ----------------------------------------------------------------------  OBSTETRICS REPORT                       (Signed Final 06/15/2019 03:09 pm) ---------------------------------------------------------------------- Patient Info  ID #:       [id number]                          D.O.B.:  Aug 09, 1991 (28 yrs)  Name:       Lindsey Calderon                 Visit Date: 06/15/2019 01:11 pm ---------------------------------------------------------------------- Performed By  Performed By:     Emeline DarlingKasie E Kiser BS,      Ref. Address:     [address]                    [address]                                                             [address]                                                             [place], [place]  [id number]  Attending:        Noralee Spaceavi Shankar MD        [facility]  Referred By:      Allie BossierMYRA C DOVE MD ---------------------------------------------------------------------- Orders   #  Description                          Code         Ordered By   1  US MFM OB DETAIL +14 WK              [id number]     MYRA DOVE  ----------------------------------------------------------------------   #  Order #                    Accession #                 Episode #   1  [id number]                  [id number]                  [id number]  ---------------------------------------------------------------------- Indications   [redacted] weeks gestation of pregnancy                Z3A.19   Encounter for antenatal screening for          Z36.3   malformations   Echogenic intracardiac focus of the heart      O35.8XX0   (EIF)  ---------------------------------------------------------------------- Vital Signs  Weight (lb): 184                               Height:        5'6"  BMI:         29.7 ---------------------------------------------------------------------- Fetal Evaluation  Num Of Fetuses:         1   Fetal Heart Rate(bpm):  159  Cardiac Activity:       Observed  Presentation:           Cephalic  Placenta:               Fundal  P. Cord Insertion:      Visualized  Amniotic Fluid  AFI FV:      Within normal limits                              Largest Pocket(cm)                              5.3 ---------------------------------------------------------------------- Biometry  BPD:      43.2  mm     G. Age:  19w 0d         54  %    CI:        81.54   %    70 - 86  FL/HC:      18.5   %    16.1 - 18.3  HC:       151   mm     G. Age:  18w 1d          9  %    HC/AC:      1.10        1.09 - 1.39  AC:      136.7  mm     G. Age:  19w 1d         49  %    FL/BPD:     64.6   %  FL:       27.9  mm     G. Age:  18w 4d         27  %    FL/AC:      20.4   %    20 - 24  NFT:       2.9  mm  Est. FW:     257  gm      0 lb 9 oz     33  % ---------------------------------------------------------------------- OB History  Gravidity:    2         Term:   1        Prem:   0        SAB:   0  TOP:          0       Ectopic:  0        Living: 1 ---------------------------------------------------------------------- Gestational Age  LMP:           19w 0d        Date:  02/02/19                 EDD:   11/09/19  U/S Today:     18w 5d                                        EDD:   11/11/19  Best:          19w 0d     Det. By:  LMP  (02/02/19)          EDD:   11/09/19 ---------------------------------------------------------------------- Anatomy  Cranium:               Appears normal         Aortic Arch:            Appears normal  Cavum:                 Appears normal         Ductal Arch:            Appears normal  Ventricles:            Appears normal         Diaphragm:              Appears normal  Choroid Plexus:        Appears normal         Stomach:                Appears normal, left  sided  Cerebellum:            Appears  normal         Abdomen:                Appears normal  Posterior Fossa:       Appears normal         Abdominal Wall:         Appears nml (cord                                                                        insert, abd wall)  Nuchal Fold:           Appears normal         Cord Vessels:           Appears normal (3                                                                        vessel cord)  Face:                  Appears normal         Kidneys:                Appear normal                         (orbits and profile)  Lips:                  Not well visualized    Bladder:                Appears normal  Thoracic:              Appears normal         Spine:                  Limited views                                                                        appear normal  Heart:                 Echogenic focus        Upper Extremities:      Appears normal                         in LV  RVOT:                  Appears normal         Lower Extremities:      Appears normal  LVOT:  Appears normal  Other:  Parents do not wish to know sex of fetus. Technically difficult due to          fetal position. ---------------------------------------------------------------------- Cervix Uterus Adnexa  Cervix  Length:            3.7  cm.  Normal appearance by transabdominal scan.  Adnexa  No abnormality visualized. ---------------------------------------------------------------------- Impression  We performed a fetal anatomy scan. An echogenic  intracardiac focus is seen. No other markers of aneuploidies  or fetal structural defects are seen. Fetal biometry is  consistent with her previously-established dates. Amniotic  fluid is normal and good fetal activity is seen. Patient  understands the limitations of ultrasound in detecting fetal  anomalies.  Echogenic intracardiac focus: I counseled the patient that  echogenic focus is seen in about 3% to 4% of normal fetuses  (more in Asian population), and in about  15%-20% of fetuses  with Down syndrome. She was reassured that echogenic  focus is not associated with any structural heart  malformations. Presence of this isolated marker only slightly  increases the a priori risk for Down syndrome.  I discussed the following options: 1) Maternal blood cell-free  fetal DNA screening  for trisomies 21, 18 and 13, which has a  greater detection rate than conventional screening tests. I  informed the patient that not all chromosomal malformations  are detected by this test. 2) I informed her that only  amniocentesis will give a definitive result on the fetal  karyotype. I discussed a procedure-related pregnancy loss of  about 1 in 500.  Patient would like to talk to her insurance provider and decide  on cell-free fetal DNA screening. She opted not to have  amniocentesis. ---------------------------------------------------------------------- Recommendations  An appointment was made for her to return in 4 weeks for  completion of fetal anatomy. ----------------------------------------------------------------------                  Noralee Space, MD Electronically Signed Final Report   06/15/2019 03:09 pm ----------------------------------------------------------------------   Assessment and Plan:  Pregnancy: G2P1001 at [redacted]w[redacted]d 1. Echogenic intracardiac focus of fetus on prenatal ultrasound Counseled about NIPS, patient will contact insurance company about cost. Assured that isolated EIF is usually not indicative of any syndrome, but will follow up ultrasound.   2. Encounter for supervision of other normal pregnancy, first trimester Preterm labor symptoms and general obstetric precautions including but not limited to vaginal bleeding, contractions, leaking of fluid and fetal movement were reviewed in detail with the patient. I discussed the assessment and treatment plan with the patient. The patient was provided an opportunity to ask questions and all were answered. The patient  agreed with the plan and demonstrated an understanding of the instructions. The patient was advised to call back or seek an in-person office evaluation/go to MAU at [facility] for any urgent or concerning symptoms. Please refer to After Visit Summary for other counseling recommendations.   I provided 15 minutes of face-to-face time during this encounter.  Return in about 4 weeks (around 07/20/2019) for Virtual OB Visit.  Future Appointments  Date Time Provider Department Center  07/13/2019  1:15 PM WH-MFC [place] 4 WH-MFCUS MFC-US    Jaynie Collins, MD Center for [facility], [facility]

## 2019-07-13 ENCOUNTER — Ambulatory Visit (HOSPITAL_COMMUNITY): Payer: No Typology Code available for payment source

## 2019-07-14 ENCOUNTER — Telehealth: Payer: Self-pay

## 2019-07-14 NOTE — Telephone Encounter (Signed)
Patient insurance has been approved for genetic testing [place]. Autho# [id number] good from August 20-Feb 7,2020

## 2019-07-21 ENCOUNTER — Encounter: Payer: Self-pay | Admitting: Obstetrics & Gynecology

## 2019-07-21 ENCOUNTER — Telehealth (INDEPENDENT_AMBULATORY_CARE_PROVIDER_SITE_OTHER): Payer: No Typology Code available for payment source | Admitting: Obstetrics & Gynecology

## 2019-07-21 ENCOUNTER — Other Ambulatory Visit: Payer: Self-pay

## 2019-07-21 VITALS — BP 126/84 | Wt 180.0 lb

## 2019-07-21 DIAGNOSIS — Z3482 Encounter for supervision of other normal pregnancy, second trimester: Secondary | ICD-10-CM

## 2019-07-21 DIAGNOSIS — O283 Abnormal ultrasonic finding on antenatal screening of mother: Secondary | ICD-10-CM

## 2019-07-21 DIAGNOSIS — Z3A24 24 weeks gestation of pregnancy: Secondary | ICD-10-CM

## 2019-07-21 NOTE — Progress Notes (Signed)
I connected with  [name] on 07/21/19 at  1:45 PM EDT by telephone and verified that I am speaking with the correct person using two identifiers.   I discussed the limitations, risks, security and privacy concerns of performing an evaluation and management service by telephone and the availability of in person appointments. I also discussed with the patient that there may be a patient responsible charge related to this service. The patient expressed understanding and agreed to proceed.  Lindsey Calderon, CMA 07/21/2019  1:45 PM

## 2019-07-21 NOTE — Progress Notes (Signed)
   TELEHEALTH OBSTETRICS PRENATAL VIRTUAL VIDEO VISIT ENCOUNTER NOTE  Provider location: [facility] for [place] [facility]   I connected with Lindsey Calderon on 07/21/19 at  1:45 PM EDT by MyChart Video Encounter at home and verified that I am speaking with the correct person using two identifiers.   I discussed the limitations, risks, security and privacy concerns of performing an evaluation and management service virtually and the availability of in person appointments. I also discussed with the patient that there may be a patient responsible charge related to this service. The patient expressed understanding and agreed to proceed. Subjective:  Lindsey Calderon is a 28 y.o. G2P1001 at [redacted]w[redacted]d being seen today for ongoing prenatal care.  She is currently monitored for the following issues for this low-risk pregnancy and has Supervision of normal pregnancy; Obesity in pregnancy; Rubella non-immune status, antepartum; and Echogenic intracardiac focus of fetus on prenatal ultrasound on their problem list.  Patient reports no complaints.  Contractions: Not present.  .  Movement: Present. Denies any leaking of fluid.   The following portions of the patient's history were reviewed and updated as appropriate: allergies, current medications, past family history, past medical history, past social history, past surgical history and problem list.   Objective:   Vitals:   07/21/19 1345  BP: 126/84  Weight: 180 lb (81.6 kg)    Fetal Status:     Movement: Present     General:  Alert, oriented and cooperative. Patient is in no acute distress.  Respiratory: Normal respiratory effort, no problems with respiration noted  Mental Status: Normal mood and affect. Normal behavior. Normal judgment and thought content.  Rest of physical exam deferred due to type of encounter  Imaging: No results found.  Assessment and Plan:  Pregnancy: G2P1001 at [redacted]w[redacted]d 1. Echogenic intracardiac focus of fetus on  prenatal ultrasound She desires NIPS, this will be drawn next week. Follow up scan also next week at [facility].  - Genetic Screening; Future  2. Encounter for supervision of other normal pregnancy in second trimester Discussed TWG -10 lb, proper nutrition in pregnancy, recommended weight gain of 11-20 lbs overall.  Preterm labor symptoms and general obstetric precautions including but not limited to vaginal bleeding, contractions, leaking of fluid and fetal movement were reviewed in detail with the patient. I discussed the assessment and treatment plan with the patient. The patient was provided an opportunity to ask questions and all were answered. The patient agreed with the plan and demonstrated an understanding of the instructions. The patient was advised to call back or seek an in-person office evaluation/go to [facility] for any urgent or concerning symptoms. Please refer to After Visit Summary for other counseling recommendations.   I provided 15 minutes of face-to-face time during this encounter.  Return in about 1 week (around 07/28/2019) for Lab appointment ([email or web address])   4 weeks: OFFICE 28 wk OB Visit, 3rd trimester labs, TDap, Flu.  Future Appointments  Date Time Provider [place]  07/28/2019  3:30 PM WH-MFC [place] 5 WH-MFCUS MFC-US    Verita Schneiders, [place] for [facility] [place], [place]

## 2019-07-21 NOTE — Patient Instructions (Signed)
Return to office for any scheduled appointments. Call the office or go to the [facility] at [facility] [place] if:  You begin to have strong, frequent contractions  Your water breaks.  Sometimes it is a big gush of fluid, sometimes it is just a trickle that keeps getting your panties wet or running down your legs  You have vaginal bleeding.  It is normal to have a small amount of spotting if your cervix was checked.   You do not feel your baby moving like normal.  If you do not, get something to eat and drink and lay down and focus on feeling your baby move.   If your baby is still not moving like normal, you should call the office or go to MAU.  Any other obstetric concerns.   

## 2019-07-24 ENCOUNTER — Ambulatory Visit (HOSPITAL_COMMUNITY): Payer: No Typology Code available for payment source

## 2019-07-28 ENCOUNTER — Other Ambulatory Visit: Payer: No Typology Code available for payment source

## 2019-07-28 ENCOUNTER — Ambulatory Visit (HOSPITAL_COMMUNITY)
Admission: RE | Admit: 2019-07-28 | Discharge: 2019-07-28 | Disposition: A | Discharge status: Home / Self Care | Payer: No Typology Code available for payment source | Source: Ambulatory Visit | Attending: Obstetrics and Gynecology | Admitting: Obstetrics and Gynecology

## 2019-07-28 ENCOUNTER — Other Ambulatory Visit: Payer: Self-pay

## 2019-07-28 DIAGNOSIS — Z362 Encounter for other antenatal screening follow-up: Secondary | ICD-10-CM | POA: Insufficient documentation

## 2019-07-28 DIAGNOSIS — Z3A25 25 weeks gestation of pregnancy: Secondary | ICD-10-CM

## 2019-07-28 DIAGNOSIS — O358XX0 Maternal care for other (suspected) fetal abnormality and damage, not applicable or unspecified: Secondary | ICD-10-CM | POA: Diagnosis not present

## 2019-07-28 NOTE — Progress Notes (Signed)
Patient presented to the office today for genetic testing.

## 2019-08-05 ENCOUNTER — Encounter: Payer: Self-pay | Admitting: Radiology

## 2019-08-19 ENCOUNTER — Ambulatory Visit (INDEPENDENT_AMBULATORY_CARE_PROVIDER_SITE_OTHER): Payer: No Typology Code available for payment source | Admitting: Advanced Practice Midwife

## 2019-08-19 ENCOUNTER — Other Ambulatory Visit: Payer: Self-pay

## 2019-08-19 VITALS — BP 103/68 | HR 96 | Wt 180.0 lb

## 2019-08-19 DIAGNOSIS — Z283 Underimmunization status: Secondary | ICD-10-CM

## 2019-08-19 DIAGNOSIS — O99210 Obesity complicating pregnancy, unspecified trimester: Secondary | ICD-10-CM

## 2019-08-19 DIAGNOSIS — O09899 Supervision of other high risk pregnancies, unspecified trimester: Secondary | ICD-10-CM

## 2019-08-19 DIAGNOSIS — O9989 Other specified diseases and conditions complicating pregnancy, childbirth and the puerperium: Secondary | ICD-10-CM

## 2019-08-19 DIAGNOSIS — Z3482 Encounter for supervision of other normal pregnancy, second trimester: Secondary | ICD-10-CM

## 2019-08-19 DIAGNOSIS — Z23 Encounter for immunization: Secondary | ICD-10-CM

## 2019-08-19 DIAGNOSIS — Z3A28 28 weeks gestation of pregnancy: Secondary | ICD-10-CM

## 2019-08-19 DIAGNOSIS — O283 Abnormal ultrasonic finding on antenatal screening of mother: Secondary | ICD-10-CM

## 2019-08-19 DIAGNOSIS — O99213 Obesity complicating pregnancy, third trimester: Secondary | ICD-10-CM

## 2019-08-19 NOTE — Patient Instructions (Signed)
Third Trimester of Pregnancy  The third trimester is from week 28 through week 40 (months 7 through 9). This trimester is when your unborn baby (fetus) is growing very fast. At the end of the ninth month, the unborn baby is about 20 inches in length. It weighs about 6-10 pounds. Follow these instructions at home: Medicines  Take over-the-counter and prescription medicines only as told by your doctor. Some medicines are safe and some medicines are not safe during pregnancy.  Take a prenatal vitamin that contains at least 600 micrograms (mcg) of folic acid.  If you have trouble pooping (constipation), take medicine that will make your stool soft (stool softener) if your doctor approves. Eating and drinking   Eat regular, healthy meals.  Avoid raw meat and uncooked cheese.  If you get low calcium from the food you eat, talk to your doctor about taking a daily calcium supplement.  Eat four or five small meals rather than three large meals a day.  Avoid foods that are high in fat and sugars, such as fried and sweet foods.  To prevent constipation: ? Eat foods that are high in fiber, like fresh fruits and vegetables, whole grains, and beans. ? Drink enough fluids to keep your pee (urine) clear or pale yellow. Activity  Exercise only as told by your doctor. Stop exercising if you start to have cramps.  Avoid heavy lifting, wear low heels, and sit up straight.  Do not exercise if it is too hot, too humid, or if you are in a place of great height (high altitude).  You may continue to have sex unless your doctor tells you not to. Relieving pain and discomfort  Wear a good support bra if your breasts are tender.  Take frequent breaks and rest with your legs raised if you have leg cramps or low back pain.  Take warm water baths (sitz baths) to soothe pain or discomfort caused by hemorrhoids. Use hemorrhoid cream if your doctor approves.  If you develop puffy, bulging veins (varicose  veins) in your legs: ? Wear support hose or compression stockings as told by your doctor. ? Raise (elevate) your feet for 15 minutes, 3-4 times a day. ? Limit salt in your food. Safety  Wear your seat belt when driving.  Make a list of emergency phone numbers, including numbers for family, friends, the hospital, and police and fire departments. Preparing for your baby's arrival To prepare for the arrival of your baby:  Take prenatal classes.  Practice driving to the hospital.  Visit the hospital and tour the maternity area.  Talk to your work about taking leave once the baby comes.  Pack your hospital bag.  Prepare the baby's room.  Go to your doctor visits.  Buy a rear-facing car seat. Learn how to install it in your car. General instructions  Do not use hot tubs, steam rooms, or saunas.  Do not use any products that contain nicotine or tobacco, such as cigarettes and e-cigarettes. If you need help quitting, ask your doctor.  Do not drink alcohol.  Do not douche or use tampons or scented sanitary pads.  Do not cross your legs for long periods of time.  Do not travel for long distances unless you must. Only do so if your doctor says it is okay.  Visit your dentist if you have not gone during your pregnancy. Use a soft toothbrush to brush your teeth. Be gentle when you floss.  Avoid cat litter boxes and soil   used by cats. These carry germs that can cause birth defects in the baby and can cause a loss of your baby (miscarriage) or stillbirth.  Keep all your prenatal visits as told by your doctor. This is important. Contact a doctor if:  You are not sure if you are in labor or if your water has broken.  You are dizzy.  You have mild cramps or pressure in your lower belly.  You have a nagging pain in your belly area.  You continue to feel sick to your stomach, you throw up, or you have watery poop.  You have bad smelling fluid coming from your vagina.  You have  pain when you pee. Get help right away if:  You have a fever.  You are leaking fluid from your vagina.  You are spotting or bleeding from your vagina.  You have severe belly cramps or pain.  You lose or gain weight quickly.  You have trouble catching your breath and have chest pain.  You notice sudden or extreme puffiness (swelling) of your face, hands, ankles, feet, or legs.  You have not felt the baby move in over an hour.  You have severe headaches that do not go away with medicine.  You have trouble seeing.  You are leaking, or you are having a gush of fluid, from your vagina before you are 37 weeks.  You have regular belly spasms (contractions) before you are 37 weeks. Summary  The third trimester is from week 28 through week 40 (months 7 through 9). This time is when your unborn baby is growing very fast.  Follow your doctor's advice about medicine, food, and activity.  Get ready for the arrival of your baby by taking prenatal classes, getting all the baby items ready, preparing the baby's room, and visiting your doctor to be checked.  Get help right away if you are bleeding from your vagina, or you have chest pain and trouble catching your breath, or if you have not felt your baby move in over an hour. This information is not intended to replace advice given to you by your health care provider. Make sure you discuss any questions you have with your health care provider. Document Released: 02/13/2010 Document Revised: 03/12/2019 Document Reviewed: 12/25/2016 Elsevier Patient Education  2020 [place].   Fetal Movement Counts Patient Name: ________________________________________________ Patient Due Date: ____________________ What is a fetal movement count?  A fetal movement count is the number of times that you feel your baby move during a certain amount of time. This may also be called a fetal kick count. A fetal movement count is recommended for every pregnant  woman. You may be asked to start counting fetal movements as early as week 28 of your pregnancy. Pay attention to when your baby is most active. You may notice your baby's sleep and wake cycles. You may also notice things that make your baby move more. You should do a fetal movement count:  When your baby is normally most active.  At the same time each day. A good time to count movements is while you are resting, after having something to eat and drink. How do I count fetal movements? 1. Find a quiet, comfortable area. Sit, or lie down on your side. 2. Write down the date, the start time and stop time, and the number of movements that you felt between those two times. Take this information with you to your health care visits. 3. For 2 hours, count kicks,  rolls, and jabs. You should feel at least 10 movements during 2 hours. 4. You may stop counting after you have felt 10 movements. 5. If you do not feel 10 movements in 2 hours, have something to eat and drink. Then, keep resting and counting for 1 hour. If you feel at least 4 movements during that hour, you may stop counting. Contact a health care provider if:  You feel fewer than 4 movements in 2 hours.  Your baby is not moving like he or she usually does. Date: ____________ Start time: ____________ Stop time: ____________ Movements: ____________ Date: ____________ Start time: ____________ Stop time: ____________ Movements: ____________ Date: ____________ Start time: ____________ Stop time: ____________ Movements: ____________ Date: ____________ Start time: ____________ Stop time: ____________ Movements: ____________ Date: ____________ Start time: ____________ Stop time: ____________ Movements: ____________ Date: ____________ Start time: ____________ Stop time: ____________ Movements: ____________ Date: ____________ Start time: ____________ Stop time: ____________ Movements: ____________ Date: ____________ Start time:  ____________ Stop time: ____________ Movements: ____________ Date: ____________ Start time: ____________ Stop time: ____________ Movements: ____________ This information is not intended to replace advice given to you by your health care provider. Make sure you discuss any questions you have with your health care provider. Document Released: 12/19/2006 Document Revised: 12/09/2018 Document Reviewed: 12/29/2015 Elsevier Patient Education  2020 Elsevier Inc.  

## 2019-08-19 NOTE — Progress Notes (Signed)
   PRENATAL VISIT NOTE  Subjective:  Lindsey Calderon is a 28 y.o. G2P1001 at [redacted]w[redacted]d being seen today for ongoing prenatal care.  She is currently monitored for the following issues for this low-risk pregnancy and has Supervision of normal pregnancy; Obesity in pregnancy; Rubella non-immune status, antepartum; and Echogenic intracardiac focus of fetus on prenatal ultrasound on their problem list.  Patient reports no complaints.  Contractions: Not present. Vag. Bleeding: None.  Movement: Present. Denies leaking of fluid.   The following portions of the patient's history were reviewed and updated as appropriate: allergies, current medications, past family history, past medical history, past social history, past surgical history and problem list. Problem list updated.  Objective:   Vitals:   08/19/19 0853  BP: 103/68  Pulse: 96  Weight: 180 lb (81.6 kg)    Fetal Status: Fetal Heart Rate (bpm): 153 Fundal Height: 29 cm Movement: Present     General:  Alert, oriented and cooperative. Patient is in no acute distress.  Skin: Skin is warm and dry. No rash noted.   Cardiovascular: Normal heart rate noted  Respiratory: Normal respiratory effort, no problems with respiration noted  Abdomen: Soft, gravid, appropriate for gestational age.  Pain/Pressure: Absent     Pelvic: Cervical exam deferred        Extremities: Normal range of motion.  Edema: None  Mental Status: Normal mood and affect. Normal behavior. Normal judgment and thought content.   Assessment and Plan:  Pregnancy: G2P1001 at [redacted]w[redacted]d  1. Encounter for supervision of other normal pregnancy in second trimester - Continue routine care - Discussed kick counts beginning around 30 weeks, interventions for not meeting kick counts - Confirmed location of MAU for ob concerns including DFM, LOF, contractions, bleeding - Flu and TDAP today - Glucose Tolerance, 2 Hours w/1 Hour - CBC - RPR - HIV Antibody (routine testing w rflx)  2. Obesity  in pregnancy - FH appropriate - weight stable, doing very well!  3. Echogenic Intracardiac Focus of fetus on prenatal ultrasound - S/p Horizon screening: low risk - No concerning findings on imaging with MFM 07/28/19  4. Rubella non-immune status, antepartum   Preterm labor symptoms and general obstetric precautions including but not limited to vaginal bleeding, contractions, leaking of fluid and fetal movement were reviewed in detail with the patient. Please refer to After Visit Summary for other counseling recommendations.  Return in about 4 weeks (around 09/16/2019) for Virtual.  No future appointments.  Darlina Rumpf, CNM

## 2019-08-20 LAB — GLUCOSE TOLERANCE, 2 HOURS W/ 1HR
Glucose, 1 hour: 119 mg/dL (ref 65–179)
Glucose, 2 hour: 93 mg/dL (ref 65–152)
Glucose, Fasting: 65 mg/dL (ref 65–91)

## 2019-08-20 LAB — CBC
Hematocrit: 32.5 % — ABNORMAL LOW (ref 34.0–46.6)
Hemoglobin: 10.8 g/dL — ABNORMAL LOW (ref 11.1–15.9)
MCH: 30.9 pg (ref 26.6–33.0)
MCHC: 33.2 g/dL (ref 31.5–35.7)
MCV: 93 fL (ref 79–97)
Platelets: 239 10*3/uL (ref 150–450)
RBC: 3.49 x10E6/uL — ABNORMAL LOW (ref 3.77–5.28)
RDW: 13.1 % (ref 11.7–15.4)
WBC: 9.2 10*3/uL (ref 3.4–10.8)

## 2019-08-20 LAB — RPR: RPR Ser Ql: NONREACTIVE

## 2019-08-20 LAB — HIV ANTIBODY (ROUTINE TESTING W REFLEX): HIV Screen 4th Generation wRfx: NONREACTIVE

## 2019-09-16 ENCOUNTER — Telehealth (INDEPENDENT_AMBULATORY_CARE_PROVIDER_SITE_OTHER): Payer: No Typology Code available for payment source | Admitting: Advanced Practice Midwife

## 2019-09-16 ENCOUNTER — Other Ambulatory Visit: Payer: Self-pay

## 2019-09-16 VITALS — BP 136/70

## 2019-09-16 DIAGNOSIS — Z283 Underimmunization status: Secondary | ICD-10-CM

## 2019-09-16 DIAGNOSIS — Z2839 Other underimmunization status: Secondary | ICD-10-CM

## 2019-09-16 DIAGNOSIS — O99891 Other specified diseases and conditions complicating pregnancy: Secondary | ICD-10-CM

## 2019-09-16 DIAGNOSIS — Z3483 Encounter for supervision of other normal pregnancy, third trimester: Secondary | ICD-10-CM

## 2019-09-16 DIAGNOSIS — Z3A32 32 weeks gestation of pregnancy: Secondary | ICD-10-CM

## 2019-09-16 NOTE — Progress Notes (Signed)
   TELEHEALTH OBSTETRICS PRENATAL VIRTUAL VIDEO VISIT ENCOUNTER NOTE  Provider location: [facility] [place] [facility]   I connected with Lindsey Calderon on 09/16/19 at  1:15 PM EDT by MyChart Video Encounter at home and verified that I am speaking with the correct person using two identifiers.   I discussed the limitations, risks, security and privacy concerns of performing an evaluation and management service virtually and the availability of in person appointments. I also discussed with the patient that there may be a patient responsible charge related to this service. The patient expressed understanding and agreed to proceed. Subjective:  Lindsey Calderon is a 28 y.o. G2P1001 at 24w2dbeing seen today for ongoing prenatal care.  She is currently monitored for the following issues for this low-risk pregnancy and has Supervision of normal pregnancy; Obesity in pregnancy; Rubella non-immune status, antepartum; and Echogenic intracardiac focus of fetus on prenatal ultrasound on their problem list.  Patient reports no complaints.  Contractions: Not present. Vag. Bleeding: None.  Movement: Present. Denies any leaking of fluid.   The following portions of the patient's history were reviewed and updated as appropriate: allergies, current medications, past family history, past medical history, past social history, past surgical history and problem list.   Objective:   Vitals:   09/16/19 1323  BP: 136/70    Fetal Status:     Movement: Present     General:  Alert, oriented and cooperative. Patient is in no acute distress.  Respiratory: Normal respiratory effort, no problems with respiration noted  Mental Status: Normal mood and affect. Normal behavior. Normal judgment and thought content.  Rest of physical exam deferred due to type of encounter  Imaging: No results found.  Assessment and Plan:  Pregnancy: G2P1001 at 370w2d. Encounter for supervision of other normal pregnancy in  third trimester - Continue routine care - Confirmed location of MAU for concerning signs/symptoms - S/p normal 28 week labs  2. Rubella non-immune status, antepartum - MMR postpartum  Preterm labor symptoms and general obstetric precautions including but not limited to vaginal bleeding, contractions, leaking of fluid and fetal movement were reviewed in detail with the patient. I discussed the assessment and treatment plan with the patient. The patient was provided an opportunity to ask questions and all were answered. The patient agreed with the plan and demonstrated an understanding of the instructions. The patient was advised to call back or seek an in-person office evaluation/[facility] or concerning [facility]. Please refer to After Visit Summary for other counseling recommendations.   I provided 10 minutes of face-to-face time during this encounter.  Return in about 4 weeks (around 10/14/2019) for 36 week Swabs (in person).  No future appointments.  SaDarlina RumpfCNMedicine Lakeor [facility] [place]

## 2019-09-16 NOTE — Patient Instructions (Signed)
First Stage of Labor °Labor is your body's natural process of moving your baby and other structures, including the placenta and umbilical cord, out of your uterus. There are three stages of labor. How long each stage lasts is different for every woman. But certain events happen during each stage that are the same for everyone. °· The first stage starts when true labor begins. This stage ends when your cervix, which is the opening from your uterus into your vagina, is completely open (dilated). °· The second stage begins when your cervix is fully dilated and you start pushing. This stage ends when your baby is born. °· The third stage is the delivery of the organ that nourished your baby during pregnancy (placenta). °First stage of labor °As your due date gets closer, you may start to notice certain physical changes that mean labor is going to start soon. You may feel that your baby has dropped lower into your pelvis. You may experience irregular, often painless, contractions that go away when you walk around or lie down (Braxton Hicks contractions). This is also called false labor. °The first stage of labor begins when you start having contractions that come at regular (evenly spaced) intervals and your cervix starts to get thinner and wider in preparation for your baby to pass through. Birth care providers measure the dilation of your cervix in centimeters (cm). One centimeter is a little less than one-half of an inch. The first stage ends when your cervix is dilated to 10 cm. The first stage of labor is divided into three phases: °· Early phase. °· Active phase. °· Transitional phase. °The length of the first stage of labor varies. It may be longer if this is your first pregnancy. You may spend most of this stage at home trying to relax and stay comfortable. °How does this affect me? °During the first stage of labor, you will move through three phases. °What happens in the early phase? °· You will start to have  regular contractions that last 30-60 seconds. Contractions may come every 5-20 minutes. Keep track of your contractions and call your birth care provider. °· Your water may break during this phase. °· You may notice a clear or slightly bloody discharge of mucus (mucus plug) from your vagina. °· Your cervix will dilate to 3-6 cm. °What happens in the active phase? °The active phase usually lasts 3-5 hours. You may go to the hospital or birth center around this time. During the active phase: °· Your contractions will become stronger, longer, and more uncomfortable. °· Your contractions may last 45-90 seconds and come every 3-5 minutes. °· You may feel lower back pain. °· Your birth care providers may examine your cervix and feel your belly to find the position of your baby. °· You may have a monitor strapped to your belly to measure your contractions and your baby's heart rate. °· You may start using your pain management options. °· Your cervix may be dilated to 6 cm and may start to dilate more quickly. °What happens in the transitional phase? °The transitional phase typically lasts from 30 minutes to 2 hours. At the end of this phase, your cervix will be fully dilated to 10 cm. During the transitional phase: °· Contractions will get stronger and longer. °· Contractions may last 60-90 seconds and come less than 2 minutes apart. °· You may feel hot flashes, chills, or nausea. °How does this affect my baby? °During the first stage of labor, your baby will   gradually move down into your birth canal. °Follow these instructions at home and in the hospital or birth center: ° °· When labor first begins, try to stay calm. You are still in the early phase. If it is night, try to get some sleep. If it is day, try to relax and save your energy. You may want to make some calls and get ready to go to the hospital or birth center. °· When you are in the early phase, try these methods to help ease discomfort: °? Deep breathing and  muscle relaxation. °? Taking a walk. °? Taking a warm bath or shower. °· Drink some fluids and have a light snack if you feel like it. °· Keep track of your contractions. °· Based on the plan you created with your birth care provider, call when your contractions indicate it is time. °· If your water breaks, note the time, color, and odor of the fluid. °· When you are in the active phase, do your breathing exercises and rely on your support people and your team of birth care providers. °Contact a health care provider if: °· Your contractions are strong and regular. °· You have lower back pain or cramping. °· Your water breaks. °· You lose your mucus plug. °Get help right away if you: °· Have a severe headache that does not go away. °· Have changes in your vision. °· Have severe pain in your upper belly. °· Do not feel the baby move. °· Have bright red bleeding. °Summary °· The first stage of labor starts when true labor begins, and it ends when your cervix is dilated to 10 cm. °· The first stage of labor has three phases: early, active, and transitional. °· Your baby moves into the birth canal during the first stage of labor. °· You may have contractions that become stronger and longer. You may also lose your mucus plug and have your water break. °· Call your birth care provider when your contractions are frequent and strong enough to go to the hospital or birth center. °This information is not intended to replace advice given to you by your health care provider. Make sure you discuss any questions you have with your health care provider. °Document Released: 02/02/2018 Document Revised: 03/12/2019 Document Reviewed: 02/02/2018 °Elsevier Patient Education © 2020 Elsevier Inc. ° °

## 2019-09-16 NOTE — Progress Notes (Signed)
I connected with  Lindsey Calderon on 09/16/19 at  1:15 PM EDT by telephone and verified that I am speaking with the correct person using two identifiers.   I discussed the limitations, risks, security and privacy concerns of performing an evaluation and management service by telephone and the availability of in person appointments. I also discussed with the patient that there may be a patient responsible charge related to this service. The patient expressed understanding and agreed to proceed.  Crosby Oyster, RN 09/16/2019  1:23 PM

## 2019-10-13 ENCOUNTER — Encounter: Payer: Self-pay | Admitting: *Deleted

## 2019-10-14 ENCOUNTER — Ambulatory Visit (INDEPENDENT_AMBULATORY_CARE_PROVIDER_SITE_OTHER): Payer: No Typology Code available for payment source | Admitting: Advanced Practice Midwife

## 2019-10-14 ENCOUNTER — Other Ambulatory Visit: Payer: Self-pay

## 2019-10-14 VITALS — BP 101/67 | HR 114 | Wt 184.0 lb

## 2019-10-14 DIAGNOSIS — Z3483 Encounter for supervision of other normal pregnancy, third trimester: Secondary | ICD-10-CM

## 2019-10-14 DIAGNOSIS — Z113 Encounter for screening for infections with a predominantly sexual mode of transmission: Secondary | ICD-10-CM

## 2019-10-14 DIAGNOSIS — Z3A36 36 weeks gestation of pregnancy: Secondary | ICD-10-CM

## 2019-10-14 NOTE — Progress Notes (Signed)
   PRENATAL VISIT NOTE  Subjective:  Lindsey Calderon is a 28 y.o. G2P1001 at [redacted]w[redacted]d being seen today for ongoing prenatal care.  She is currently monitored for the following issues for this low-risk pregnancy and has Supervision of normal pregnancy; Rubella non-immune status, antepartum; and Echogenic intracardiac focus of fetus on prenatal ultrasound on their problem list.  Patient reports no complaints.  Contractions: Not present. Vag. Bleeding: None.  Movement: Present. Denies leaking of fluid.    The following portions of the patient's history were reviewed and updated as appropriate: allergies, current medications, past family history, past medical history, past social history, past surgical history and problem list.   Objective:   Vitals:   10/14/19 1335  BP: 101/67  Pulse: (!) 114  Weight: 184 lb (83.5 kg)    Fetal Status: Fetal Heart Rate (bpm): 152 Fundal Height: 37 cm Movement: Present  Presentation: Vertex  General:  Alert, oriented and cooperative. Patient is in no acute distress.  Skin: Skin is warm and dry. No rash noted.   Cardiovascular: Normal heart rate noted  Respiratory: Normal respiratory effort, no problems with respiration noted  Abdomen: Soft, gravid, appropriate for gestational age.  Pain/Pressure: Absent     Pelvic: Cervical exam performed Dilation: Closed Effacement (%): Thick Station: Ballotable  Extremities: Normal range of motion.  Edema: None  Mental Status: Normal mood and affect. Normal behavior. Normal judgment and thought content.   Assessment and Plan:  Pregnancy: G2P1001 at [redacted]w[redacted]d 1. Encounter for supervision of other normal pregnancy in third trimester Lindsey Calderon's pregnancy is progressing appropriately; she has no complaints today. Patient desires Nexplanon for postpartum contraception.  - GBS - GC/Chlamydia - follow up visit at 37 weeks - written prescription for breast pump  Preterm labor symptoms and general obstetric precautions including but  not limited to vaginal bleeding, contractions, leaking of fluid and fetal movement were reviewed in detail with the patient. Please refer to After Visit Summary for other counseling recommendations.   Return in about 1 week (around 10/21/2019) for Virtual.  Future Appointments  Date Time Provider [place]  10/21/2019  3:45 PM Caren Macadam, MD [facility]    Mercy Moore, Medical Student  I confirm that I have verified the information documented in the medical student's note and that I have also personally reperformed the history, physical exam and all medical decision making activities of this service and have verified that all service and findings are accurately documented in this student's note.   --Continue weekly BP checks, daily ASA 81 mg --Nexplanon for contraception, box updated  Mallie Snooks, CNM 10/14/2019 2:56 PM

## 2019-10-14 NOTE — Patient Instructions (Signed)

## 2019-10-16 LAB — GC/CHLAMYDIA PROBE AMP (~~LOC~~) NOT AT ARMC
Chlamydia: NEGATIVE
Comment: NEGATIVE
Comment: NORMAL
Neisseria Gonorrhea: NEGATIVE

## 2019-10-16 LAB — STREP GP B NAA: Strep Gp B NAA: POSITIVE — AB

## 2019-10-18 ENCOUNTER — Encounter: Payer: Self-pay | Admitting: Advanced Practice Midwife

## 2019-10-18 DIAGNOSIS — B951 Streptococcus, group B, as the cause of diseases classified elsewhere: Secondary | ICD-10-CM | POA: Insufficient documentation

## 2019-10-21 ENCOUNTER — Other Ambulatory Visit: Payer: Self-pay

## 2019-10-21 ENCOUNTER — Telehealth (INDEPENDENT_AMBULATORY_CARE_PROVIDER_SITE_OTHER): Payer: No Typology Code available for payment source | Admitting: Family Medicine

## 2019-10-21 VITALS — BP 150/94

## 2019-10-21 DIAGNOSIS — O163 Unspecified maternal hypertension, third trimester: Secondary | ICD-10-CM

## 2019-10-21 DIAGNOSIS — Z283 Underimmunization status: Secondary | ICD-10-CM

## 2019-10-21 DIAGNOSIS — B951 Streptococcus, group B, as the cause of diseases classified elsewhere: Secondary | ICD-10-CM

## 2019-10-21 DIAGNOSIS — O09899 Supervision of other high risk pregnancies, unspecified trimester: Secondary | ICD-10-CM

## 2019-10-21 DIAGNOSIS — O99820 Streptococcus B carrier state complicating pregnancy: Secondary | ICD-10-CM

## 2019-10-21 DIAGNOSIS — O99891 Other specified diseases and conditions complicating pregnancy: Secondary | ICD-10-CM

## 2019-10-21 DIAGNOSIS — Z3483 Encounter for supervision of other normal pregnancy, third trimester: Secondary | ICD-10-CM

## 2019-10-21 DIAGNOSIS — O283 Abnormal ultrasonic finding on antenatal screening of mother: Secondary | ICD-10-CM

## 2019-10-21 DIAGNOSIS — Z3A37 37 weeks gestation of pregnancy: Secondary | ICD-10-CM

## 2019-10-21 NOTE — Patient Instructions (Signed)
Preeclampsia and Eclampsia °Preeclampsia is a serious condition that may develop during pregnancy. This condition causes high blood pressure and increased protein in your urine along with other symptoms, such as headaches and vision changes. These symptoms may develop as the condition gets worse. Preeclampsia may occur at 20 weeks of pregnancy or later. °Diagnosing and treating preeclampsia early is very important. If not treated early, it can cause serious problems for you and your baby. One problem it can lead to is eclampsia. Eclampsia is a condition that causes muscle jerking or shaking (convulsions or seizures) and other serious problems for the mother. During pregnancy, delivering your baby may be the best treatment for preeclampsia or eclampsia. For most women, preeclampsia and eclampsia symptoms go away after giving birth. °In rare cases, a woman may develop preeclampsia after giving birth (postpartum preeclampsia). This usually occurs within 48 hours after childbirth but may occur up to 6 weeks after giving birth. °What are the causes? °The cause of preeclampsia is not known. °What increases the risk? °The following risk factors make you more likely to develop preeclampsia: °· Being pregnant for the first time. °· Having had preeclampsia during a past pregnancy. °· Having a family history of preeclampsia. °· Having high blood pressure. °· Being pregnant with more than one baby. °· Being 35 or older. °· Being African-American. °· Having kidney disease or diabetes. °· Having medical conditions such as lupus or blood diseases. °· Being very overweight (obese). °What are the signs or symptoms? °The most common symptoms are: °· Severe headaches. °· Vision problems, such as blurred or double vision. °· Abdominal pain, especially upper abdominal pain. °Other symptoms that may develop as the condition gets worse include: °· Sudden weight gain. °· Sudden swelling of the hands, face, legs, and feet. °· Severe nausea  and vomiting. °· Numbness in the face, arms, legs, and feet. °· Dizziness. °· Urinating less than usual. °· Slurred speech. °· Convulsions or seizures. °How is this diagnosed? °There are no screening tests for preeclampsia. Your health care provider will ask you about symptoms and check for signs of preeclampsia during your prenatal visits. You may also have tests that include: °· Checking your blood pressure. °· Urine tests to check for protein. Your health care provider will check for this at every prenatal visit. °· Blood tests. °· Monitoring your baby's heart rate. °· Ultrasound. °How is this treated? °You and your health care provider will determine the treatment approach that is best for you. Treatment may include: °· Having more frequent prenatal exams to check for signs of preeclampsia, if you have an increased risk for preeclampsia. °· Medicine to lower your blood pressure. °· Staying in the hospital, if your condition is severe. There, treatment will focus on controlling your blood pressure and the amount of fluids in your body (fluid retention). °· Taking medicine (magnesium sulfate) to prevent seizures. This may be given as an injection or through an IV. °· Taking a low-dose aspirin during your pregnancy. °· Delivering your baby early. You may have your labor started with medicine (induced), or you may have a cesarean delivery. °Follow these instructions at home: °Eating and drinking ° °· Drink enough fluid to keep your urine pale yellow. °· Avoid caffeine. °Lifestyle °· Do not use any products that contain nicotine or tobacco, such as cigarettes and e-cigarettes. If you need help quitting, ask your health care provider. °· Do not use alcohol or drugs. °· Avoid stress as much as possible. Rest and get   plenty of sleep. °General instructions °· Take over-the-counter and prescription medicines only as told by your health care provider. °· When lying down, lie on your left side. This keeps pressure off your  major blood vessels. °· When sitting or lying down, raise (elevate) your feet. Try putting some pillows underneath your lower legs. °· Exercise regularly. Ask your health care provider what kinds of exercise are best for you. °· Keep all follow-up and prenatal visits as told by your health care provider. This is important. °How is this prevented? °There is no known way of preventing preeclampsia or eclampsia from developing. However, to lower your risk of complications and detect problems early: °· Get regular prenatal care. Your health care provider may be able to diagnose and treat the condition early. °· Maintain a healthy weight. Ask your health care provider for help managing weight gain during pregnancy. °· Work with your health care provider to manage any long-term (chronic) health conditions you have, such as diabetes or kidney problems. °· You may have tests of your blood pressure and kidney function after giving birth. °· Your health care provider may have you take low-dose aspirin during your next pregnancy. °Contact a health care provider if: °· You have symptoms that your health care provider told you may require more treatment or monitoring, such as: °? Headaches. °? Nausea or vomiting. °? Abdominal pain. °? Dizziness. °? Light-headedness. °Get help right away if: °· You have severe: °? Abdominal pain. °? Headaches that do not get better. °? Dizziness. °? Vision problems. °? Confusion. °? Nausea or vomiting. °· You have any of the following: °? A seizure. °? Sudden, rapid weight gain. °? Sudden swelling in your hands, ankles, or face. °? Trouble moving any part of your body. °? Numbness in any part of your body. °? Trouble speaking. °? Abnormal bleeding. °· You faint. °Summary °· Preeclampsia is a serious condition that may develop during pregnancy. °· This condition causes high blood pressure and increased protein in your urine along with other symptoms, such as headaches and vision  changes. °· Diagnosing and treating preeclampsia early is very important. If not treated early, it can cause serious problems for you and your baby. °· Get help right away if you have symptoms that your health care provider told you to watch for. °This information is not intended to replace advice given to you by your health care provider. Make sure you discuss any questions you have with your health care provider. °Document Released: 11/16/2000 Document Revised: 07/22/2018 Document Reviewed: 06/25/2016 °Elsevier Patient Education © 2020 [employer] Inc. ° °

## 2019-10-21 NOTE — Progress Notes (Signed)
I connected with  Lindsey Calderon on 10/21/19 at  3:45 PM EST by telephone and verified that I am speaking with the correct person using two identifiers.   I discussed the limitations, risks, security and privacy concerns of performing an evaluation and management service by telephone and the availability of in person appointments. I also discussed with the patient that there may be a patient responsible charge related to this service. The patient expressed understanding and agreed to proceed.  Crosby Oyster, RN 10/21/2019  3:50 PM   Pt states she has been more stressed today.

## 2019-10-21 NOTE — Progress Notes (Signed)
I connected with@ on 10/21/19 at  3:45 PM EST by: Mychart and verified that I am speaking with the correct person using two identifiers.  Patient is located at home and provider is located at [facility].     The purpose of this virtual visit is to provide medical care while limiting exposure to the novel coronavirus. I discussed the limitations, risks, security and privacy concerns of performing an evaluation and management service by mychart and the availability of in person appointments. I also discussed with the patient that there may be a patient responsible charge related to this service. By engaging in this virtual visit, you consent to the provision of healthcare.  Additionally, you authorize for your insurance to be billed for the services provided during this visit.  The patient expressed understanding and agreed to proceed.   The following staff members participated in the virtual visit:  Crosby Oyster     PRENATAL VISIT NOTE  Subjective:  Lindsey Calderon is a 28 y.o. G2P1001 at [redacted]w[redacted]d for phone visit for ongoing prenatal care.  She is currently monitored for the following issues for this low-risk pregnancy and has Supervision of normal pregnancy; Rubella non-immune status, antepartum; Echogenic intracardiac focus of fetus on prenatal ultrasound; and Positive GBS test on their problem list.  Patient reports no complaints.  Contractions: Irregular. Vag. Bleeding: None.  Movement: Present. Denies leaking of fluid.   The following portions of the patient's history were reviewed and updated as appropriate: allergies, current medications, past family history, past medical history, past social history, past surgical history and problem list.   Objective:   Vitals:   10/21/19 1549 10/21/19 1610  BP: 140/90 (!) 150/94   Self-Obtained  Fetal Status:     Movement: Present     Assessment and Plan:  Pregnancy: G2P1001 at 313w2d1. Rubella non-immune status, antepartum MMR pp  2. Encounter  for supervision of other normal pregnancy in third trimester Up to date  Reviewed labor and PEC precuations  3. Positive GBS test PCN in labor  4. Echogenic intracardiac focus of fetus on prenatal ultrasound  5. Elevated blood pressure affecting pregnancy in third trimester, antepartum Initial BP elevated, repeat was 150/94, no concerning features or symptoms. Patient reports she is stressed today due to heat being off this afternoon and she is coordinating repairs.  Of note patient uses a wrist BP cuff which can have falsely elevated BP values.  We reviewed my concern for gHTN/PEC. Patient agrees to BP check tomorrow Reviewed s/sx of preeclampsia  Preterm labor symptoms and general obstetric precautions including but not limited to vaginal bleeding, contractions, leaking of fluid and fetal movement were reviewed in detail with the patient.  Return in about 1 day (around 10/22/2019) for BP check at 430PM.  No future appointments.   Time spent on virtual visit: 10 minutes  KiCaren MacadamMD

## 2019-10-22 ENCOUNTER — Telehealth: Payer: Self-pay | Admitting: *Deleted

## 2019-10-22 NOTE — Telephone Encounter (Signed)
Pt called and spoke to [facility] at the front desk  stating she was running a fever and did not feel good today and was supposed to come in for a BP check today, she took BP at home at it was 120/70.

## 2019-10-28 ENCOUNTER — Telehealth (INDEPENDENT_AMBULATORY_CARE_PROVIDER_SITE_OTHER): Payer: No Typology Code available for payment source | Admitting: Advanced Practice Midwife

## 2019-10-28 ENCOUNTER — Encounter: Payer: Self-pay | Admitting: *Deleted

## 2019-10-28 VITALS — BP 130/83 | HR 80

## 2019-10-28 DIAGNOSIS — Z3A38 38 weeks gestation of pregnancy: Secondary | ICD-10-CM

## 2019-10-28 DIAGNOSIS — Z2839 Other underimmunization status: Secondary | ICD-10-CM

## 2019-10-28 DIAGNOSIS — B951 Streptococcus, group B, as the cause of diseases classified elsewhere: Secondary | ICD-10-CM

## 2019-10-28 DIAGNOSIS — Z283 Underimmunization status: Secondary | ICD-10-CM

## 2019-10-28 DIAGNOSIS — Z3483 Encounter for supervision of other normal pregnancy, third trimester: Secondary | ICD-10-CM

## 2019-10-28 NOTE — Progress Notes (Signed)
I connected with  Lindsey Calderon on 10/28/19 at  1:00 PM EST by telephone and verified that I am speaking with the correct person using two identifiers.   I discussed the limitations, risks, security and privacy concerns of performing an evaluation and management service by telephone and the availability of in person appointments. I also discussed with the patient that there may be a patient responsible charge related to this service. The patient expressed understanding and agreed to proceed.  Lindsey Calderon, [place] 10/28/2019  1:06 PM

## 2019-10-28 NOTE — Patient Instructions (Signed)

## 2019-10-28 NOTE — Progress Notes (Signed)
   TELEHEALTH OBSTETRICS PRENATAL VIRTUAL VIDEO VISIT ENCOUNTER NOTE  Provider location: [facility] [place] [facility]   I connected with Lindsey Calderon on 10/28/19 at  1:00 PM EST by MyChart Video Encounter at home and verified that I am speaking with the correct person using two identifiers.   I discussed the limitations, risks, security and privacy concerns of performing an evaluation and management service virtually and the availability of in person appointments. I also discussed with the patient that there may be a patient responsible charge related to this service. The patient expressed understanding and agreed to proceed. Subjective:  Lindsey Calderon is a 28 y.o. G2P1001 at 26w2dbeing seen today for ongoing prenatal care.  She is currently monitored for the following issues for this low-risk pregnancy and has Supervision of normal pregnancy; Rubella non-immune status, antepartum; Echogenic intracardiac focus of fetus on prenatal ultrasound; and Positive GBS test on their problem list.  Patient reports no complaints.  Contractions: Irregular.  .  Movement: Present. Denies any leaking of fluid.   The following portions of the patient's history were reviewed and updated as appropriate: allergies, current medications, past family history, past medical history, past social history, past surgical history and problem list.   Objective:   Vitals:   10/28/19 1305  BP: 130/83  Pulse: 80    Fetal Status:     Movement: Present     General:  Alert, oriented and cooperative. Patient is in no acute distress.  Respiratory: Normal respiratory effort, no problems with respiration noted  Mental Status: Normal mood and affect. Normal behavior. Normal judgment and thought content.  Rest of physical exam deferred due to type of encounter  Imaging: No results found.  Assessment and Plan:  Pregnancy: G2P1001 at 327w2d. Encounter for supervision of other normal pregnancy in third  trimester - No complaints or concerns, continue routine care - Preemptive teaching re: Cochrane review supporting membrane sweep at 39 and 40 weeks if possible. Risks/benefits reviewed  2. Positive GBS test - PCN prophylaxis in labor  3. Rubella non-immune status, antepartum - MMR pp  Term labor symptoms and general obstetric precautions including but not limited to vaginal bleeding, contractions, leaking of fluid and fetal movement were reviewed in detail with the patient. I discussed the assessment and treatment plan with the patient. The patient was provided an opportunity to ask questions and all were answered. The patient agreed with the plan and demonstrated an understanding of the instructions. The patient was advised to call back or seek an in-person office evaluation/[facility] any [facility] or concerning symptoms. Please refer to After Visit Summary for other counseling recommendations.   I provided 10 minutes of face-to-face time during this encounter.  No follow-ups on file.  Future Appointments  Date Time Provider [place]12/01/2019 11:00 AM NeCaren MacadamMD [facility]

## 2019-11-04 ENCOUNTER — Ambulatory Visit (INDEPENDENT_AMBULATORY_CARE_PROVIDER_SITE_OTHER): Payer: No Typology Code available for payment source | Admitting: Family Medicine

## 2019-11-04 ENCOUNTER — Encounter (HOSPITAL_COMMUNITY): Payer: Self-pay | Admitting: *Deleted

## 2019-11-04 ENCOUNTER — Encounter: Payer: Self-pay | Admitting: Family Medicine

## 2019-11-04 ENCOUNTER — Other Ambulatory Visit: Payer: Self-pay

## 2019-11-04 ENCOUNTER — Telehealth (HOSPITAL_COMMUNITY): Payer: Self-pay | Admitting: *Deleted

## 2019-11-04 VITALS — BP 114/78 | HR 106 | Wt 178.0 lb

## 2019-11-04 DIAGNOSIS — O99891 Other specified diseases and conditions complicating pregnancy: Secondary | ICD-10-CM

## 2019-11-04 DIAGNOSIS — Z3483 Encounter for supervision of other normal pregnancy, third trimester: Secondary | ICD-10-CM

## 2019-11-04 DIAGNOSIS — O283 Abnormal ultrasonic finding on antenatal screening of mother: Secondary | ICD-10-CM

## 2019-11-04 DIAGNOSIS — O09899 Supervision of other high risk pregnancies, unspecified trimester: Secondary | ICD-10-CM

## 2019-11-04 DIAGNOSIS — O09293 Supervision of pregnancy with other poor reproductive or obstetric history, third trimester: Secondary | ICD-10-CM

## 2019-11-04 DIAGNOSIS — O09299 Supervision of pregnancy with other poor reproductive or obstetric history, unspecified trimester: Secondary | ICD-10-CM

## 2019-11-04 DIAGNOSIS — Z2839 Other underimmunization status: Secondary | ICD-10-CM

## 2019-11-04 DIAGNOSIS — Z3A39 39 weeks gestation of pregnancy: Secondary | ICD-10-CM

## 2019-11-04 DIAGNOSIS — Z283 Underimmunization status: Secondary | ICD-10-CM

## 2019-11-04 DIAGNOSIS — B951 Streptococcus, group B, as the cause of diseases classified elsewhere: Secondary | ICD-10-CM

## 2019-11-04 NOTE — Telephone Encounter (Signed)
Preadmission screen  

## 2019-11-04 NOTE — Progress Notes (Signed)
   PRENATAL VISIT NOTE  Subjective:  Lindsey Calderon is a 28 y.o. G2P1001 at 49w2dbeing seen today for ongoing prenatal care.  She is currently monitored for the following issues for this low-risk pregnancy and has Supervision of normal pregnancy; Rubella non-immune status, antepartum; Echogenic intracardiac focus of fetus on prenatal ultrasound; Positive GBS test; and Hx of maternal laceration, 3rd degree, currently pregnant on their problem list.  Patient reports no complaints/mild cramping occasionally.  Contractions: Irritability. Vag. Bleeding: None.  Movement: Present. Denies leaking of fluid.   The following portions of the patient's history were reviewed and updated as appropriate: allergies, current medications, past family history, past medical history, past social history, past surgical history and problem list.   Objective:   Vitals:   11/04/19 1102  BP: 114/78  Pulse: (!) 106  Weight: 178 lb (80.7 kg)    Fetal Status: Fetal Heart Rate (bpm): 143   Movement: Present     General:  Alert, oriented and cooperative. Patient is in no acute distress.  Skin: Skin is warm and dry. No rash noted.   Cardiovascular: Normal heart rate noted  Respiratory: Normal respiratory effort, no problems with respiration noted  Abdomen: Soft, gravid, appropriate for gestational age.  Pain/Pressure: Absent     Pelvic: Cervical exam performed Dilation: 4 Effacement (%): 70 Station: -2  Extremities: Normal range of motion.  Edema: Trace  Mental Status: Normal mood and affect. Normal behavior. Normal judgment and thought content.   Assessment and Plan:  Pregnancy: G2P1001 at 334w2d. Echogenic intracardiac focus of fetus on prenatal ultrasound  2. Rubella non-immune status, antepartum MMR pp  3. Encounter for supervision of other normal pregnancy in third trimester Up to date Discussed pain management in labor, had an unmedicated birth in 2066orried about repeat 3rd degree and discussed this  with patient Reviewed likelihood of labor prior to 41 wk, declined sweeping today  4. Positive GBS test PCN in labor  Term labor symptoms and general obstetric precautions including but not limited to vaginal bleeding, contractions, leaking of fluid and fetal movement were reviewed in detail with the patient. Please refer to After Visit Summary for other counseling recommendations.   Return in about 1 week (around 11/11/2019) for Routine prenatal care, in person.  Future Appointments  Date Time Provider [place]12/08/2019  1:00 PM WeDarlina Rumpf[facility]  11/13/2019  9:05 AM MC-SCREENING MC-SDSC None  11/15/2019 12:05 AM MC-LD SCHED ROOM MC-INDC None    KiCaren MacadamMD

## 2019-11-08 ENCOUNTER — Inpatient Hospital Stay (HOSPITAL_COMMUNITY)
Admission: AD | Admit: 2019-11-08 | Discharge: 2019-11-10 | DRG: 807 | Disposition: A | Discharge status: Home / Self Care | Payer: No Typology Code available for payment source | Attending: Obstetrics & Gynecology | Admitting: Obstetrics & Gynecology

## 2019-11-08 ENCOUNTER — Other Ambulatory Visit: Payer: Self-pay

## 2019-11-08 ENCOUNTER — Encounter (HOSPITAL_COMMUNITY): Payer: Self-pay | Admitting: *Deleted

## 2019-11-08 DIAGNOSIS — Z2839 Other underimmunization status: Secondary | ICD-10-CM

## 2019-11-08 DIAGNOSIS — Z20828 Contact with and (suspected) exposure to other viral communicable diseases: Secondary | ICD-10-CM | POA: Diagnosis present

## 2019-11-08 DIAGNOSIS — O99824 Streptococcus B carrier state complicating childbirth: Secondary | ICD-10-CM | POA: Diagnosis present

## 2019-11-08 DIAGNOSIS — O4292 Full-term premature rupture of membranes, unspecified as to length of time between rupture and onset of labor: Secondary | ICD-10-CM | POA: Diagnosis present

## 2019-11-08 DIAGNOSIS — O09299 Supervision of pregnancy with other poor reproductive or obstetric history, unspecified trimester: Secondary | ICD-10-CM

## 2019-11-08 DIAGNOSIS — O4290 Premature rupture of membranes, unspecified as to length of time between rupture and onset of labor, unspecified weeks of gestation: Secondary | ICD-10-CM | POA: Diagnosis present

## 2019-11-08 DIAGNOSIS — Z283 Underimmunization status: Secondary | ICD-10-CM

## 2019-11-08 DIAGNOSIS — O09899 Supervision of other high risk pregnancies, unspecified trimester: Secondary | ICD-10-CM

## 2019-11-08 DIAGNOSIS — Z3A39 39 weeks gestation of pregnancy: Secondary | ICD-10-CM

## 2019-11-08 LAB — CBC
HCT: 32.8 % — ABNORMAL LOW (ref 36.0–46.0)
Hemoglobin: 10.6 g/dL — ABNORMAL LOW (ref 12.0–15.0)
MCH: 29 pg (ref 26.0–34.0)
MCHC: 32.3 g/dL (ref 30.0–36.0)
MCV: 89.6 fL (ref 80.0–100.0)
Platelets: 350 10*3/uL (ref 150–400)
RBC: 3.66 MIL/uL — ABNORMAL LOW (ref 3.87–5.11)
RDW: 13.2 % (ref 11.5–15.5)
WBC: 9.6 10*3/uL (ref 4.0–10.5)
nRBC: 0 % (ref 0.0–0.2)

## 2019-11-08 LAB — TYPE AND SCREEN
ABO/RH(D): A POS
Antibody Screen: NEGATIVE

## 2019-11-08 LAB — POCT FERN TEST: POCT Fern Test: POSITIVE

## 2019-11-08 LAB — ABO/RH: ABO/RH(D): A POS

## 2019-11-08 LAB — SARS CORONAVIRUS 2 BY RT PCR (HOSPITAL ORDER, PERFORMED IN ~~LOC~~ HOSPITAL LAB): SARS Coronavirus 2: NEGATIVE

## 2019-11-08 MED ORDER — COCONUT OIL OIL: 1.0000 "application " | TOPICAL_OIL | Status: DC | PRN

## 2019-11-08 MED ORDER — SIMETHICONE 80 MG PO CHEW: 80.0000 mg | CHEWABLE_TABLET | ORAL | Status: DC | PRN

## 2019-11-08 MED ORDER — SODIUM CHLORIDE 0.9 % IV SOLN
5.0000 10*6.[IU] | Freq: Once | INTRAVENOUS | Status: AC
  Administered 2019-11-08: 5 10*6.[IU] via INTRAVENOUS
  Filled 2019-11-08: qty 5

## 2019-11-08 MED ORDER — OXYTOCIN 40 UNITS IN NORMAL SALINE INFUSION - SIMPLE MED
1.0000 m[IU]/min | INTRAVENOUS | Status: DC
  Administered 2019-11-08: 2 m[IU]/min via INTRAVENOUS
  Filled 2019-11-08: qty 1000

## 2019-11-08 MED ORDER — OXYCODONE-ACETAMINOPHEN 5-325 MG PO TABS: 1.0000 | ORAL_TABLET | ORAL | Status: DC | PRN

## 2019-11-08 MED ORDER — BISACODYL 10 MG RE SUPP: 10.0000 mg | Freq: Every day | RECTAL | Status: DC | PRN

## 2019-11-08 MED ORDER — ONDANSETRON HCL 4 MG/2ML IJ SOLN: 4.0000 mg | INTRAMUSCULAR | Status: DC | PRN

## 2019-11-08 MED ORDER — OXYTOCIN BOLUS FROM INFUSION
500.0000 mL | Freq: Once | INTRAVENOUS | Status: AC
  Administered 2019-11-08: 500 mL via INTRAVENOUS

## 2019-11-08 MED ORDER — ACETAMINOPHEN 325 MG PO TABS: 650.0000 mg | ORAL_TABLET | ORAL | Status: DC | PRN

## 2019-11-08 MED ORDER — FLEET ENEMA 7-19 GM/118ML RE ENEM: 1.0000 | ENEMA | Freq: Every day | RECTAL | Status: DC | PRN

## 2019-11-08 MED ORDER — IBUPROFEN 600 MG PO TABS
600.0000 mg | ORAL_TABLET | Freq: Four times a day (QID) | ORAL | Status: DC
  Administered 2019-11-08 – 2019-11-09 (×3): 600 mg via ORAL
  Filled 2019-11-08 (×3): qty 1

## 2019-11-08 MED ORDER — OXYCODONE-ACETAMINOPHEN 5-325 MG PO TABS: 2.0000 | ORAL_TABLET | ORAL | Status: DC | PRN

## 2019-11-08 MED ORDER — OXYTOCIN 40 UNITS IN NORMAL SALINE INFUSION - SIMPLE MED: 2.5000 [IU]/h | INTRAVENOUS | Status: DC

## 2019-11-08 MED ORDER — PENICILLIN G POT IN DEXTROSE 60000 UNIT/ML IV SOLN
3.0000 10*6.[IU] | INTRAVENOUS | Status: DC
  Administered 2019-11-08: 3 10*6.[IU] via INTRAVENOUS
  Filled 2019-11-08: qty 50

## 2019-11-08 MED ORDER — LACTATED RINGERS IV SOLN: 500.0000 mL | INTRAVENOUS | Status: DC | PRN

## 2019-11-08 MED ORDER — TERBUTALINE SULFATE 1 MG/ML IJ SOLN: 0.2500 mg | Freq: Once | INTRAMUSCULAR | Status: DC | PRN

## 2019-11-08 MED ORDER — ONDANSETRON HCL 4 MG PO TABS: 4.0000 mg | ORAL_TABLET | ORAL | Status: DC | PRN

## 2019-11-08 MED ORDER — TETANUS-DIPHTH-ACELL PERTUSSIS 5-2.5-18.5 LF-MCG/0.5 IM SUSP: 0.5000 mL | Freq: Once | INTRAMUSCULAR | Status: DC

## 2019-11-08 MED ORDER — BENZOCAINE-MENTHOL 20-0.5 % EX AERO: 1.0000 "application " | INHALATION_SPRAY | CUTANEOUS | Status: DC | PRN

## 2019-11-08 MED ORDER — DIPHENHYDRAMINE HCL 25 MG PO CAPS: 25.0000 mg | ORAL_CAPSULE | Freq: Four times a day (QID) | ORAL | Status: DC | PRN

## 2019-11-08 MED ORDER — DIBUCAINE (PERIANAL) 1 % EX OINT: 1.0000 "application " | TOPICAL_OINTMENT | CUTANEOUS | Status: DC | PRN

## 2019-11-08 MED ORDER — WITCH HAZEL-GLYCERIN EX PADS: 1.0000 "application " | MEDICATED_PAD | CUTANEOUS | Status: DC | PRN

## 2019-11-08 MED ORDER — LACTATED RINGERS IV SOLN
INTRAVENOUS | Status: DC
  Administered 2019-11-08: 11:00:00 via INTRAVENOUS

## 2019-11-08 MED ORDER — FENTANYL CITRATE (PF) 100 MCG/2ML IJ SOLN
INTRAMUSCULAR | Status: AC
  Filled 2019-11-08: qty 2

## 2019-11-08 MED ORDER — OXYCODONE-ACETAMINOPHEN 5-325 MG PO TABS
2.0000 | ORAL_TABLET | Freq: Once | ORAL | Status: AC
  Administered 2019-11-08: 2 via ORAL
  Filled 2019-11-08: qty 2

## 2019-11-08 MED ORDER — SENNOSIDES-DOCUSATE SODIUM 8.6-50 MG PO TABS
2.0000 | ORAL_TABLET | ORAL | Status: DC
  Administered 2019-11-08 – 2019-11-09 (×2): 2 via ORAL
  Filled 2019-11-08 (×2): qty 2

## 2019-11-08 MED ORDER — SOD CITRATE-CITRIC ACID 500-334 MG/5ML PO SOLN: 30.0000 mL | ORAL | Status: DC | PRN

## 2019-11-08 MED ORDER — PRENATAL MULTIVITAMIN CH
1.0000 | ORAL_TABLET | Freq: Every day | ORAL | Status: DC
  Administered 2019-11-09: 1 via ORAL
  Filled 2019-11-08: qty 1

## 2019-11-08 MED ORDER — FLEET ENEMA 7-19 GM/118ML RE ENEM: 1.0000 | ENEMA | RECTAL | Status: DC | PRN

## 2019-11-08 MED ORDER — LIDOCAINE HCL (PF) 1 % IJ SOLN: 30.0000 mL | INTRAMUSCULAR | Status: DC | PRN

## 2019-11-08 MED ORDER — ONDANSETRON HCL 4 MG/2ML IJ SOLN: 4.0000 mg | Freq: Four times a day (QID) | INTRAMUSCULAR | Status: DC | PRN

## 2019-11-08 MED ORDER — ZOLPIDEM TARTRATE 5 MG PO TABS: 5.0000 mg | ORAL_TABLET | Freq: Every evening | ORAL | Status: DC | PRN

## 2019-11-08 MED ORDER — FENTANYL CITRATE (PF) 100 MCG/2ML IJ SOLN
50.0000 ug | INTRAMUSCULAR | Status: DC | PRN
  Administered 2019-11-08: 100 ug via INTRAVENOUS

## 2019-11-08 NOTE — Progress Notes (Signed)
Vtx by bedside US. 

## 2019-11-08 NOTE — Lactation Note (Signed)
This note was copied from a baby's chart. Lactation Consultation Note  Patient Name: Lindsey Calderon HRCBU'L Date: 11/08/2019 Reason for consult: Initial assessment;Term  6 hours old FT female who is being exclusively BF by her mother, she's a P2 and experienced BF. She BF her first child for 18 months and she's already familiar with hand expression but doesn't like doing it, she told LC "it never worked for me, I wasn't able to get much hand expressing" mom prefers putting baby directly to breast. She has a Spectra 2 DEBP at home.  Offered assistance with latch but mom politely declined, she told LC baby already had a feeding, and that she's been spitting up and she wasn't sure if she was even ready. Baby had a medium emesis during [place] consultation it was mucus and colostrum, she most likely transferred during her last feeding, praised mom for her efforts and asked her to call for assistance when needed.   LC helped cleaned baby up, and dad helped change baby's diaper, she had a void and a stool, documented in Flowsheets. Reviewed normal newborn behavior, feeding cues and cluster feeding.   Feeding plan:  1. Encouraged mom to feed baby STS 8-12 times/24 hours or sooner if feeding cues are present 2. Hand expression and spoon feeding were also encouraged, however mom may chose not to do it  BF brochure, BF resources and feeding diary were reviewed. Parents reported all questions and concerns were answered, they're both aware of [place] OP services and will call PRN.   Maternal Data Formula Feeding for Exclusion: No Has patient been taught Hand Expression?: Yes Does the patient have breastfeeding experience prior to this delivery?: Yes  Feeding    LATCH Score                   Interventions Interventions: Breast feeding basics reviewed  Lactation Tools Discussed/Used WIC Program: No   Consult Status Consult Status: Follow-up Date: 11/09/19 Follow-up type:  In-patient    Anastazja Isaac Francene Boyers 11/08/2019, 9:58 PM

## 2019-11-08 NOTE — Discharge Summary (Signed)
Postpartum Discharge Summary     Patient Name: Lindsey Calderon DOB: 1991-03-12 MRN: [id number]  Date of admission: 11/08/2019 Delivering Provider: Sharene Butters D   Date of discharge: 11/10/2019  Admitting diagnosis: water broke  Intrauterine pregnancy: [redacted]w[redacted]d    Secondary diagnosis:  Active Problems:   ROM (rupture of membranes), premature   SVD (spontaneous vaginal delivery)  Additional problems: None     Discharge diagnosis: Term Pregnancy Delivered                                                                                                Post partum procedures: None  Augmentation: Pitocin  Complications: None  Hospital course:  Onset of Labor With Vaginal Delivery     28y.o. yo G2P1001 at 320w6das admitted in Active Labor on 11/08/2019. Patient had an uncomplicated labor course as follows:  Membrane Rupture Time/Date: 7:00 AM ,11/08/2019   Intrapartum Procedures: Episiotomy: None [1]                                         Lacerations:  Periurethral [8]  Patient had a delivery of a Viable infant. 11/08/2019  Information for the patient's newborn:  WaBlondine, Hottel0[[id number]]Delivery Method: Vaginal, Spontaneous(Filed from Delivery Summary)     Pateint had an uncomplicated postpartum course.  She is ambulating, tolerating a regular diet, passing flatus, and urinating well. Patient is discharged home in stable condition on 11/10/19.  Delivery time: 3:12 PM    Magnesium Sulfate received: No BMZ received: No Rhophylac:N/A MMR:N/A Transfusion:No  Physical exam  Vitals:   11/09/19 0545 11/09/19 1447 11/09/19 2225 11/10/19 0532  BP: 105/69 118/76 101/60 117/83  Pulse: 74 87 62 63  Resp:  '17 18 18  ' Temp: 98.5 F (36.9 C) 98.7 F (37.1 C) 98.2 F (36.8 C) 98.3 F (36.8 C)  TempSrc:  Oral Oral Oral  SpO2: 100% 100%  100%  Weight:      Height:       General: alert, cooperative and no distress Lochia: appropriate Uterine Fundus: firm Incision: N/A DVT  Evaluation: No evidence of DVT seen on physical exam. Negative Homan's sign. No cords or calf tenderness. No significant calf/ankle edema. Labs: Lab Results  Component Value Date   WBC 12.5 (H) 11/09/2019   HGB 9.1 (L) 11/09/2019   HCT 28.0 (L) 11/09/2019   MCV 89.2 11/09/2019   PLT 265 11/09/2019   CMP Latest Ref Rng & Units 05/04/2019  Glucose 65 - 99 mg/dL 83  BUN 6 - 20 mg/dL 7  Creatinine 0.57 - 1.00 mg/dL 0.66  Sodium 134 - 144 mmol/L 140  Potassium 3.5 - 5.2 mmol/L 4.1  Chloride 96 - 106 mmol/L 103  CO2 20 - 29 mmol/L 19(L)  Calcium 8.7 - 10.2 mg/dL 9.8  Total Protein 6.0 - 8.5 g/dL 7.0  Total Bilirubin 0.0 - 1.2 mg/dL 0.3  Alkaline Phos 39 - 117 IU/L 59  AST 0 - 40 IU/L 12  ALT  0 - 32 IU/L 9    Discharge instruction: per After Visit Summary and "Baby and Me Booklet".  After visit meds:  Allergies as of 11/10/2019   No Known Allergies     Medication List    STOP taking these medications   nitrofurantoin (macrocrystal-monohydrate) 100 MG capsule Commonly known as: MACROBID     TAKE these medications   acetaminophen 325 MG tablet Commonly known as: Tylenol Take 2 tablets (650 mg total) by mouth every 4 (four) hours as needed (for pain scale < 4).   multivitamin-prenatal 27-0.8 MG Tabs tablet Take 1 tablet by mouth daily at 12 noon.       Diet: routine diet  Activity: Advance as tolerated. Pelvic rest for 6 weeks.   Outpatient follow up:4 weeks Follow up Appt: Future Appointments  Date Time Provider [place]  11/13/2019  9:05 AM MC-SCREENING MC-SDSC None  12/09/2019  2:15 PM Darlina Rumpf, CNM [facility]   Follow up Visit:  Please schedule this patient for PP visit in: 4 weeks Low risk pregnancy complicated by: n/a Delivery mode:  SVD Anticipated Birth Control:  Nexplanon (at post partum visit) PP Procedures needed: n/a  Schedule Integrated [place] visit: no Provider: Any provider   Newborn Data: Live born female   Birth Weight:  3155 g APGAR: 35, 10   Newborn Delivery   Birth date/time: 11/08/2019 15:12:00 Delivery type: Vaginal, Spontaneous      Baby Feeding: Breast Disposition:home with mother

## 2019-11-08 NOTE — H&P (Addendum)
OBSTETRIC ADMISSION HISTORY AND PHYSICAL  Lindsey Calderon is a 28 y.o. female G2P1001 with IUP at [redacted]w[redacted]d by LMP presenting for PROM. She notes that around 7AM she had some vaginal leakage of clear fluid which progressed to large gushes of fluid over the next 1-2 hours so she presented to [facility]U for evaluation. She was found to have premature rupture of membranes with +ferning, so was admitted for IOL. She reports +FMs, No LOF, no VB, no blurry vision, headaches or peripheral edema, and RUQ pain.  She plans on breast feeding. She request neplanon for birth control. She received her prenatal care at [facility]   Dating: By LMP --->  Estimated Date of Delivery: 11/09/19  Sono:    @[redacted]w[redacted]d , CWD, echogenic intracardiac focus, cephalic presentation, posterior placenta, 720g, 21% EFW   Prenatal History/Complications: GBS+  Past Medical History: Past Medical History:  Diagnosis Date  . Medical history non-contributory     Past Surgical History: Past Surgical History:  Procedure Laterality Date  . NO PAST SURGERIES    . WISDOM TOOTH EXTRACTION    . WISDOM TOOTH EXTRACTION      Obstetrical History: OB History    Gravida  2   Para  1   Term  1   Preterm      AB      Living  1     SAB      TAB      Ectopic      Multiple      Live Births  1           Social History: Social History   Socioeconomic History  . Marital status: Married    Spouse name: Not on file  . Number of children: Not on file  . Years of education: Not on file  . Highest education level: Not on file  Occupational History  . Not on file  Social Needs  . Financial resource strain: Not on file  . Food insecurity    Worry: Not on file    Inability: Not on file  . Transportation needs    Medical: Not on file    Non-medical: Not on file  Tobacco Use  . Smoking status: Never Smoker  . Smokeless tobacco: Never Used  Substance and Sexual Activity  . Alcohol use: Not Currently    Comment: 1 drink  occ  . Drug use: No  . Sexual activity: Yes    Birth control/protection: None  Lifestyle  . Physical activity    Days per week: Not on file    Minutes per session: Not on file  . Stress: Not on file  Relationships  . Social Herbalist on phone: Not on file    Gets together: Not on file    Attends religious service: Not on file    Active member of club or organization: Not on file    Attends meetings of clubs or organizations: Not on file    Relationship status: Not on file  Other Topics Concern  . Not on file  Social History Narrative   Work or School: [employer] Situation: lives with boyfriend 42 yo son (2015)      Spiritual Beliefs: Christian      Lifestyle: walking 3 days per week; diet is fair             Family History: Family History  Problem Relation Age of Onset  .  Hypertension Mother   . Thyroid disease Mother   . Cancer Mother        throat cancer  . Hypertension Father   . Heart failure Father     Allergies: No Known Allergies  Medications Prior to Admission  Medication Sig Dispense Refill Last Dose  . nitrofurantoin, macrocrystal-monohydrate, (MACROBID) 100 MG capsule Take 1 capsule (100 mg total) by mouth 2 (two) times daily. (Patient not taking: Reported on 06/22/2019) 14 capsule 1   . Prenatal Vit-Fe Fumarate-FA (MULTIVITAMIN-PRENATAL) 27-0.8 MG TABS tablet Take 1 tablet by mouth daily at 12 noon.        Review of Systems   All systems reviewed and negative except as stated in HPI  Blood pressure 132/76, pulse (!) 121, temperature 98.5 F (36.9 C), temperature source Oral, resp. rate 17, last menstrual period 02/02/2019, SpO2 100 %. General appearance: alert, cooperative and appears stated age Lungs: clear to auscultation bilaterally Heart: regular rate and rhythm Abdomen: soft, non-tender; bowel sounds normal Pelvic: deferred, charted by nursing.  Extremities: Homans sign is negative, no sign of  DVT Presentation: cephalic Fetal monitoringBaseline: 135 bpm, Variability: Good {> 6 bpm), Accelerations: Reactive and Decelerations: occasional variables Uterine activityFrequency: Every 10 minutes, Duration: 90 seconds and Intensity: mild     Prenatal labs: ABO, Rh: A/Positive/-- (06/01 1551) Antibody: Negative (06/01 1551) Rubella: <0.90 (06/01 1551) RPR: Non Reactive (09/16 0855)  HBsAg: Negative (06/01 1551)  HIV: Non Reactive (09/16 0855)  GBS: --/Positive (11/11 1350)  1 hr Glucola normal Genetic screening  declined Anatomy US echogenic intracardiac focus  Prenatal Transfer Tool  Maternal Diabetes: No Genetic Screening: Declined Maternal Ultrasounds/Referrals: Isolated EIF (echogenic intracardiac focus) Fetal Ultrasounds or other Referrals:  Referred to Materal Fetal Medicine  Maternal Substance Abuse:  No Significant Maternal Medications:  None Significant Maternal Lab Results: Group B Strep positive  Results for orders placed or performed during the hospital encounter of 11/08/19 (from the past 24 hour(s))  POCT fern test   Collection Time: 11/08/19 10:14 AM  Result Value Ref Range   POCT Fern Test Positive = ruptured amniotic membanes     Patient Active Problem List   Diagnosis Date Noted  . ROM (rupture of membranes), premature 11/08/2019  . Hx of maternal laceration, 3rd degree, currently pregnant 11/04/2019  . Positive GBS test 10/18/2019  . Echogenic intracardiac focus of fetus on prenatal ultrasound 06/22/2019  . Rubella non-immune status, antepartum 05/06/2019  . Supervision of normal pregnancy 05/04/2019    Assessment/Plan:  Lindsey Calderon is a 28 y.o. G2P1001 at [redacted]w[redacted]d here for PROM.   #Labor: Making some cervical change but still appears to be in early labor. Will start pitocin when FHT improves. Anticipate vaginal delivery.  #Pain: IV fentanyl as needed #FWB: Cat II FHT w/ occasional variables. Will try to reposition and continue to monitor closely.  She may require amnioinfusion as per history she has lost a good deal of fluid.  #ID: GBS+, start PenG q4h. COVID pending, asymptomatic. #MOF: breast #MOC:nexplanon  Denzil Hughes, MD  11/08/2019, 10:45 AM   I confirm that I have verified the information documented in the resident's note and that I have also personally reperformed the history, physical exam and all medical decision making activities of this service and have verified that all service and findings are accurately documented in this student's note.   Rolm Bookbinder, [place] 11/08/2019 12:01 PM

## 2019-11-08 NOTE — MAU Note (Signed)
Lindsey Calderon is a 28 y.o. at [redacted]w[redacted]d here in MAU reporting:  +LOF. Clear Denies VB States was 4cm at last office visit. Desires iv pain medication at this time for labor. Onset of complaint: 8am Pain score: 1/10. "mild" lower abdominal cramping. Intermittent  NPO status: 8am today (pancakes and eggs) Plans on breast feeding. Currently undecided for peds.  States GBS + Vitals:   11/08/19 1015  BP: 132/76  Pulse: (!) 121  Resp: 17  Temp: 98.5 F (36.9 C)  SpO2: 100%     FHT: 150; +FM Lab orders placed from triage: mau labor triage

## 2019-11-09 LAB — CBC
HCT: 28 % — ABNORMAL LOW (ref 36.0–46.0)
Hemoglobin: 9.1 g/dL — ABNORMAL LOW (ref 12.0–15.0)
MCH: 29 pg (ref 26.0–34.0)
MCHC: 32.5 g/dL (ref 30.0–36.0)
MCV: 89.2 fL (ref 80.0–100.0)
Platelets: 265 10*3/uL (ref 150–400)
RBC: 3.14 MIL/uL — ABNORMAL LOW (ref 3.87–5.11)
RDW: 13.4 % (ref 11.5–15.5)
WBC: 12.5 10*3/uL — ABNORMAL HIGH (ref 4.0–10.5)
nRBC: 0 % (ref 0.0–0.2)

## 2019-11-09 LAB — RPR: RPR Ser Ql: NONREACTIVE

## 2019-11-09 MED ORDER — COMPLETENATE 29-1 MG PO CHEW
1.0000 | CHEWABLE_TABLET | Freq: Every day | ORAL | Status: DC
  Administered 2019-11-09 – 2019-11-10 (×2): 1 via ORAL
  Filled 2019-11-09: qty 1

## 2019-11-09 MED ORDER — LIDOCAINE HCL 1 % IJ SOLN
0.0000 mL | Freq: Once | INTRAMUSCULAR | Status: DC | PRN
  Filled 2019-11-09: qty 20

## 2019-11-09 MED ORDER — MEASLES, MUMPS & RUBELLA VAC IJ SOLR: 0.5000 mL | Freq: Once | INTRAMUSCULAR | Status: DC

## 2019-11-09 MED ORDER — MEASLES, MUMPS & RUBELLA VAC IJ SOLR
0.5000 mL | Freq: Once | INTRAMUSCULAR | Status: AC
  Administered 2019-11-10: 0.5 mL via SUBCUTANEOUS
  Filled 2019-11-09: qty 0.5

## 2019-11-09 MED ORDER — IBUPROFEN 100 MG/5ML PO SUSP
600.0000 mg | Freq: Four times a day (QID) | ORAL | Status: DC
  Administered 2019-11-09 – 2019-11-10 (×4): 600 mg via ORAL
  Filled 2019-11-09 (×4): qty 30

## 2019-11-09 MED ORDER — ETONOGESTREL 68 MG ~~LOC~~ IMPL
68.0000 mg | DRUG_IMPLANT | Freq: Once | SUBCUTANEOUS | Status: DC
  Filled 2019-11-09: qty 1

## 2019-11-09 NOTE — Progress Notes (Signed)
Post Partum Day 1  Subjective:  Lindsey Calderon is a 28 y.o. W1U2725 [redacted]w[redacted]d s/p SVD.  No acute events overnight.  Pt denies problems with ambulating, voiding or po intake.  She denies nausea or vomiting.  Pain is well controlled.  She has had flatus. She has not had bowel movement.  Lochia Small.  Plan for birth control is Nexplanon.  Method of Feeding: Breast  Objective: BP (P) 105/69   Pulse (P) 74   Temp (P) 98.5 F (36.9 C)   Resp 18   Ht 5\' 5"  (1.651 m)   Wt 79.4 kg   LMP 02/02/2019 (Approximate)   SpO2 (P) 100%   Breastfeeding Unknown   BMI 29.12 kg/m   Physical Exam:  General: alert, cooperative and no distress Lochia:normal flow Chest: normal work of breathing Heart: regular rate Abdomen: soft, nontender Uterine Fundus: firm DVT Evaluation: No evidence of DVT seen on physical exam.   Recent Labs    11/08/19 1038 11/09/19 0526  HGB 10.6* 9.1*  HCT 32.8* 28.0*    Assessment/Plan:  ASSESSMENT: Lindsey Calderon is a 28 y.o. G2P2002 [redacted]w[redacted]d ppd #1 s/p NSVD doing well.   Plan for discharge tomorrow Continue routine PP care   LOS: 1 day   Demetrius Revel, DO 11/09/2019, 9:22 AM

## 2019-11-09 NOTE — Lactation Note (Signed)
This note was copied from a baby's chart. Lactation Consultation Note  Patient Name: Lindsey Calderon [id number] Date: 11/09/2019 Reason for consult: Follow-up assessment;Term Baby is 25 hours old.  She has been sleepy but recently waking up and feeding better.  Mom has baby in cross cradle hold.  Baby opens wide but mom needed reminded to bring baby to breast.  Observed baby latch easily.  Reviewed waking techniques and skin to skin encouraged.  Encouraged to call for assist prn.  Maternal Data    Feeding Feeding Type: Breast Fed  LATCH Score Latch: Grasps breast easily, tongue down, lips flanged, rhythmical sucking.  Audible Swallowing: A few with stimulation  Type of Nipple: Everted at rest and after stimulation  Comfort (Breast/Nipple): Soft / non-tender  Hold (Positioning): No assistance needed to correctly position infant at breast.  LATCH Score: 9  Interventions    Lactation Tools Discussed/Used     Consult Status Consult Status: Follow-up Date: 11/10/19 Follow-up type: In-patient    Ave Filter 11/09/2019, 10:59 AM

## 2019-11-10 MED ORDER — ACETAMINOPHEN 325 MG PO TABS: 650.0000 mg | ORAL_TABLET | ORAL | 0 refills | Status: AC | PRN

## 2019-11-10 NOTE — Lactation Note (Signed)
This note was copied from a baby's chart. Lactation Consultation Note LC and [place] student greeted and introduced themselves to mom. Mother mentioned nipple pain when first latching infant and [place] student checked for any nipple trauma. [facility] student noted that there was no trauma to either nipple and suggested that mother ensure baby have a wide open mouth before latching deep onto the breast tissue. [facility] student demonstrated the football and cross cradle hold and the benefits of alternating positions while breastfeeding to prevent any nipple trauma. LC mentioned online resources that the mother could review if she needed a visual later about how to do these positions at home.  Hand expression was taught and mother seemed comfortable with hand expression. The hand expressed colostrum was rubbed in and mother was told of the healing properties it can have to her breast. LC also noticed nipple cream and mentioned a light layer should only be used on the nipple and shaft so as not to obstruct the [place] glands. LC mentioned that hand expression can be done before attaching baby to breast once milks comes in and breasts are full. Hand pump was also given to mother and demonstrated how to use and clean it after each use. 1mm flange seemed an appropriate size but she was told how to properly fit it and when to adjust sizes. Cue-based feeding was reiterated and 8-12 feeds should occur in a 24 hour period. The importance of skin-to-skin was mentioned to regulate the babies temperature or soothe baby when they go home. Discussed the prevention and treatment of engorgement.  Outpatient resources and support groups were pointed out in the handbook and mother was told Lactation could be reached 24/7 if need be.   Patient Name: Lindsey Calderon [id number] Date: 11/10/2019 Reason for consult: Follow-up assessment   Maternal Data    Feeding Feeding Type: Breast Fed  LATCH Score                    Interventions Interventions: Skin to skin;Breast massage;Hand express;Pre-pump if needed;Breast compression;Adjust position;Support pillows;Position options;Hand pump;Ice  Lactation Tools Discussed/Used     Consult Status Consult Status: Complete    Lindsey Calderon 11/10/2019, 9:49 AM

## 2019-11-10 NOTE — Discharge Instructions (Signed)

## 2019-11-11 ENCOUNTER — Encounter: Payer: No Typology Code available for payment source | Admitting: Advanced Practice Midwife

## 2019-11-13 ENCOUNTER — Other Ambulatory Visit (HOSPITAL_COMMUNITY): Payer: No Typology Code available for payment source

## 2019-11-15 ENCOUNTER — Inpatient Hospital Stay (HOSPITAL_COMMUNITY): Payer: No Typology Code available for payment source

## 2019-11-15 ENCOUNTER — Inpatient Hospital Stay (HOSPITAL_COMMUNITY)
Admission: AD | Admit: 2019-11-15 | Payer: No Typology Code available for payment source | Source: Home / Self Care | Admitting: Family Medicine

## 2019-12-08 ENCOUNTER — Ambulatory Visit: Payer: No Typology Code available for payment source | Admitting: Obstetrics & Gynecology

## 2019-12-09 ENCOUNTER — Ambulatory Visit: Payer: No Typology Code available for payment source | Admitting: Advanced Practice Midwife

## 2019-12-11 NOTE — Progress Notes (Signed)
Post Partum Exam  Lindsey Calderon is a 29 y.o. G19P2002 female who presents for a postpartum visit. She is 6 weeks postpartum following a spontaneous vaginal delivery. I have fully reviewed the prenatal and intrapartum course. The delivery was at 39.6 gestational weeks.  Anesthesia: IV sedation. Postpartum course has been uncomplicated. Baby's course has been uncomplicated. Baby is feeding by breast and bottle. Bleeding staining only. Bowel function is normal. Bladder function is normal. Patient is sexually active- using condoms every time, reports sex on Sunday 1/10. Contraception method is Nexplanon. Postpartum depression screening:neg  The following portions of the patient's history were reviewed and updated as appropriate: allergies, current medications, past family history, past medical history, past social history, past surgical history and problem list. Last pap smear done  and was 05/04/2019 normal  Review of Systems Pertinent items are noted in HPI.    Objective:  Last menstrual period 02/02/2019, unknown if currently breastfeeding.  General:  alert, cooperative and appears stated age   Breasts:  no concerns noted  Lungs: Normal WOB  Heart:  regular rate and rhythm, S1, S2 normal, no murmur, click, rub or gallop  Abdomen: soft, non-tender; bowel sounds normal; no masses,  no organomegaly   Vulva:  not evaluated  Vagina: not evaluated  Cervix:  not indicated  Corpus: not examined  Adnexa:  not evaluated  Rectal Exam: Not performed.          Nexplanon Insertion Procedure Patient identified, informed consent performed, consent signed.   Patient does understand that irregular bleeding is a very common side effect of this medication. She was advised to have backup contraception after placement. Patient was determined to meet WHO criteria for not being pregnant. Appropriate time out taken.  The insertion site was identified 8-10 cm (3-4 inches) from the medial epicondyle of the humerus  and 3-5 cm (1.25-2 inches) posterior to (below) the sulcus (groove) between the biceps and triceps muscles of the patient's left arm and marked.  Patient was prepped with alcohol swab and then injected with 3 ml of 1% lidocaine.  Arm was prepped with betadine, Nexplanon removed from packaging,  Device confirmed in needle, then inserted full length of needle and withdrawn per handbook instructions. Nexplanon was able to palpated in the patient's arm; patient palpated the insert herself. There was minimal blood loss.  Patient insertion site covered with guaze and a pressure bandage to reduce any bruising.  The patient tolerated the procedure well and was given post procedure instructions.   Assessment:   Normal postpartum exam. Pap smear not done at today's visit.   Plan:   1. Contraception: Nexplanon. Discussed low but present risk of pregnancy. Patient is virtually exclusively BF and uses condoms with partner. Offered Plan B today but patient concerned about effect on milk supply. Reviewed plan to place device today and have patient repeat urine pregnancy test in 2 weeks. Patient agrees to plan for placement and the home PT testing. Recommended condom use for 2 weeks post placement until negative PT.  2.Infant feeding- exclusively nursing, reports < 1 bottle of formula and this is only when she out which is not often.  Reviewed lactation support and importance of on demand feeding 3. Mood: low risk screening today. Counseled about resources and mood changes within the next year are considered related to pregnancy.  3. Follow up in: 1 year or as needed.

## 2019-12-14 ENCOUNTER — Encounter: Payer: Self-pay | Admitting: Family Medicine

## 2019-12-14 ENCOUNTER — Other Ambulatory Visit: Payer: Self-pay

## 2019-12-14 ENCOUNTER — Ambulatory Visit (INDEPENDENT_AMBULATORY_CARE_PROVIDER_SITE_OTHER): Payer: No Typology Code available for payment source | Admitting: Family Medicine

## 2019-12-14 DIAGNOSIS — Z3046 Encounter for surveillance of implantable subdermal contraceptive: Secondary | ICD-10-CM | POA: Diagnosis not present

## 2019-12-14 DIAGNOSIS — Z3202 Encounter for pregnancy test, result negative: Secondary | ICD-10-CM

## 2019-12-14 DIAGNOSIS — Z1389 Encounter for screening for other disorder: Secondary | ICD-10-CM

## 2019-12-14 DIAGNOSIS — Z30017 Encounter for initial prescription of implantable subdermal contraceptive: Secondary | ICD-10-CM

## 2019-12-14 LAB — POCT URINE PREGNANCY: Preg Test, Ur: NEGATIVE

## 2019-12-14 MED ORDER — ETONOGESTREL 68 MG ~~LOC~~ IMPL
68.0000 mg | DRUG_IMPLANT | Freq: Once | SUBCUTANEOUS | Status: AC
  Administered 2019-12-14: 68 mg via SUBCUTANEOUS

## 2021-04-05 IMAGING — US US MFM OB DETAIL +14 WK
1 of 2 series · 12 of 28 positions shown · non-contrast
Comparison: none

[Series 1: us mfm ob detail +14 wk · 12 of 144 slices shown]
[im 6/144]
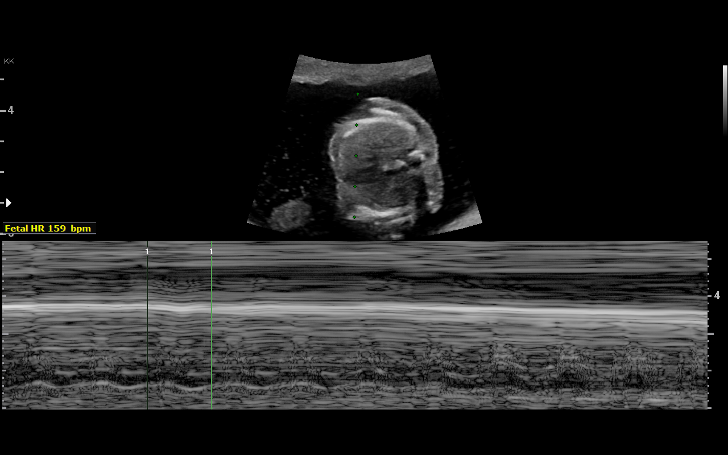
[im 17/144]
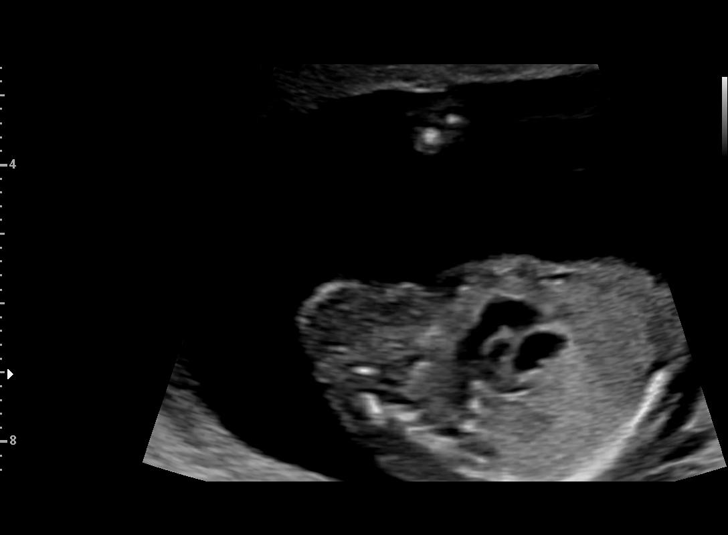
[im 28/144]
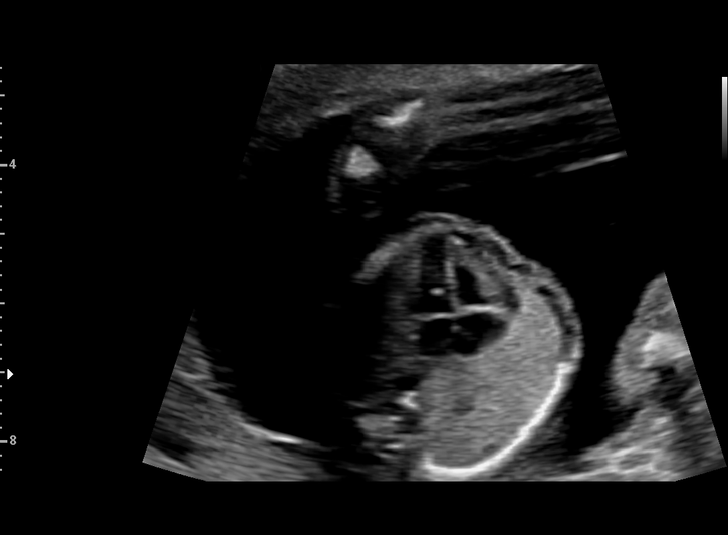
[im 45/144]
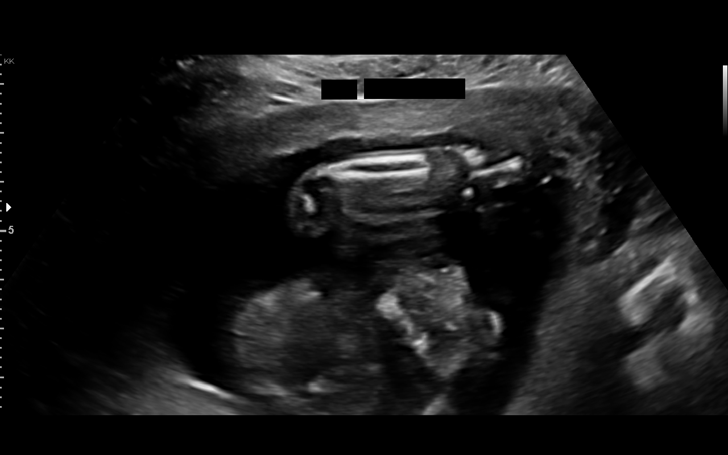
[im 56/144]
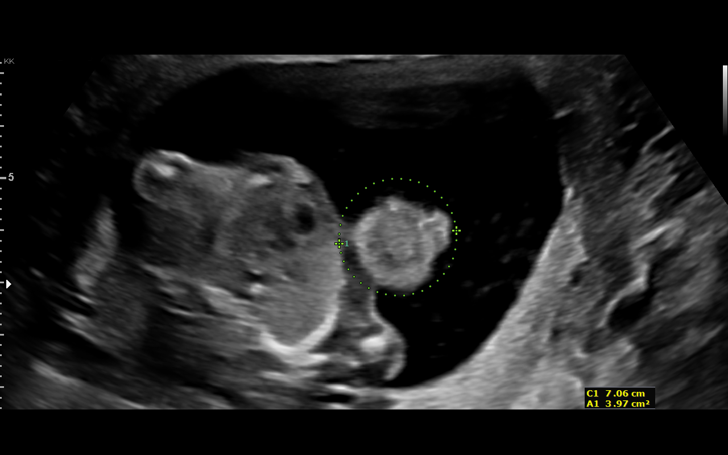
[im 67/144]
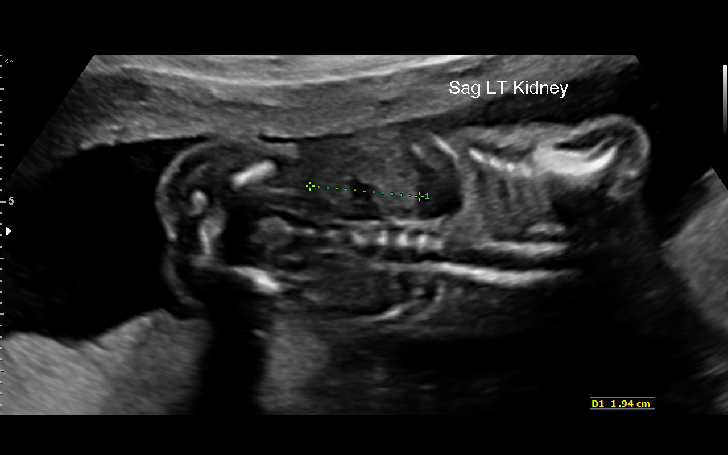
[im 83/144]
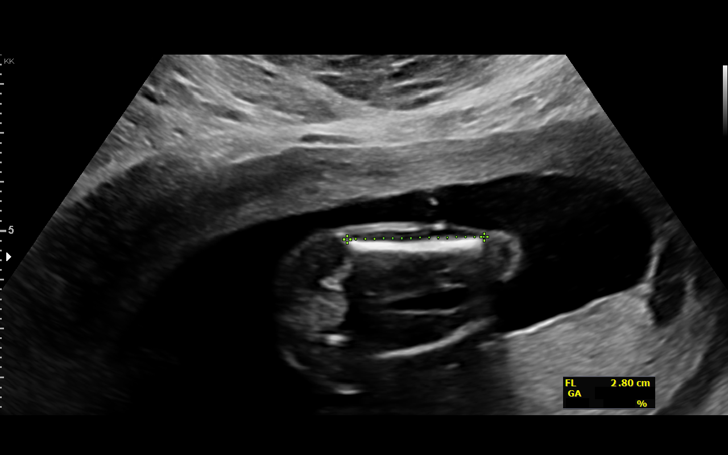
[im 94/144]
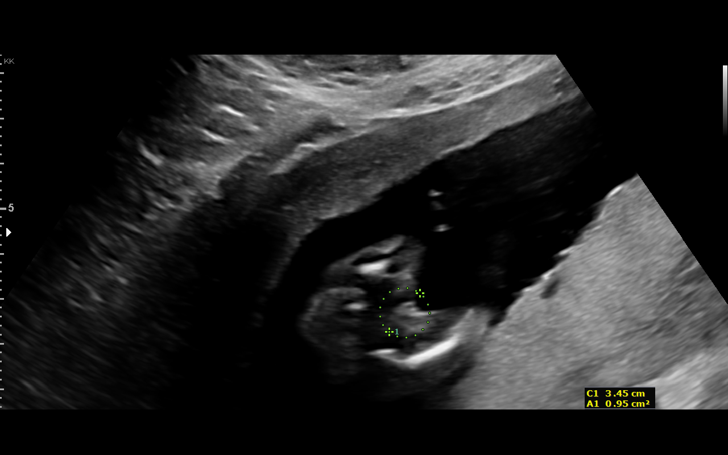
[im 105/144]
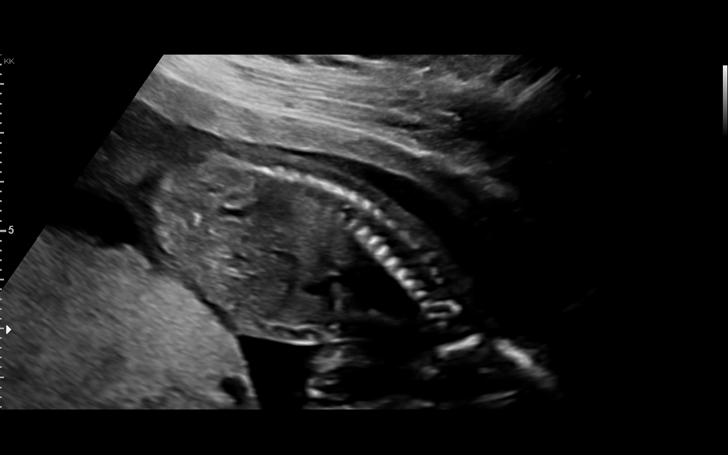
[im 122/144]
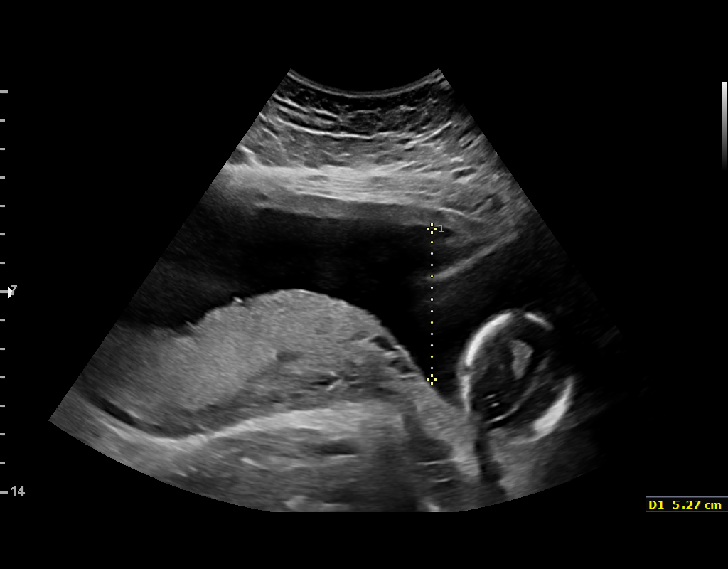
[im 133/144]
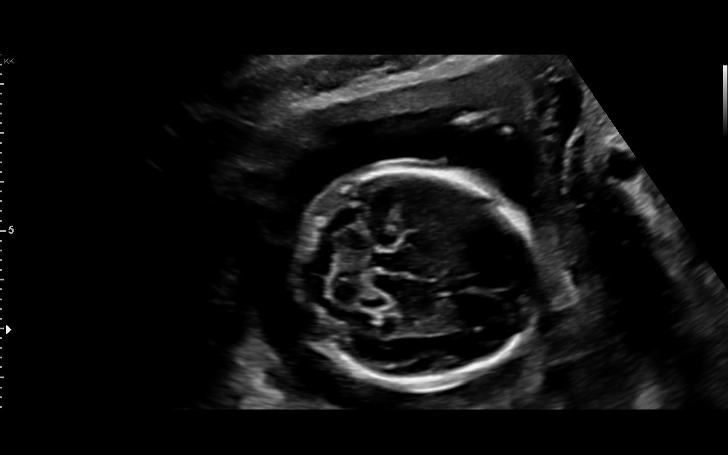
[im 144/144]
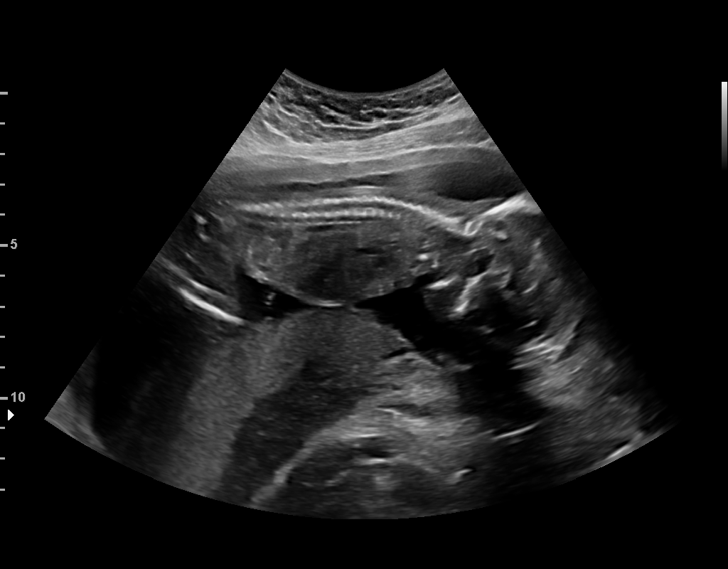

[12 of 28 positions shown; findings below may reference images not displayed]

----------------------------------------------------------------------

 ----------------------------------------------------------------------
Indications

  19 weeks gestation of pregnancy
  Encounter for antenatal screening for
  malformations
  Echogenic intracardiac focus of the heart
  (EIF)
 ----------------------------------------------------------------------
Vital Signs

 BMI:
Fetal Evaluation

 Num Of Fetuses:         1
 Fetal Heart Rate(bpm):  159
 Cardiac Activity:       Observed
 Presentation:           Cephalic
 Placenta:               Fundal
 P. Cord Insertion:      Visualized

 Amniotic Fluid
 AFI FV:      Within normal limits

                             Largest Pocket(cm)

Biometry

 BPD:      43.2  mm     G. Age:  19w 0d         54  %    CI:        81.54   %    70 - 86
                                                         FL/HC:      18.5   %    16.1 -
 HC:       151   mm     G. Age:  18w 1d          9  %    HC/AC:      1.10        1.09 -
 AC:      136.7  mm     G. Age:  19w 1d         49  %    FL/BPD:     64.6   %
 FL:       27.9  mm     G. Age:  18w 4d         27  %    FL/AC:      20.4   %    20 - 24
 NFT:       2.9  mm

 Est. FW:     257  gm      0 lb 9 oz     33  %
OB History

 Gravidity:    2         Term:   1        Prem:   0        SAB:   0
 TOP:          0       Ectopic:  0        Living: 1
Gestational Age

 LMP:           19w 0d        Date:  02/02/19                 EDD:   11/09/19
 U/S Today:     18w 5d                                        EDD:   11/11/19
 Best:          19w 0d     Det. By:  LMP  (02/02/19)          EDD:   11/09/19
Anatomy

 Cranium:               Appears normal         Aortic Arch:            Appears normal
 Cavum:                 Appears normal         Ductal Arch:            Appears normal
 Ventricles:            Appears normal         Diaphragm:              Appears normal
 Choroid Plexus:        Appears normal         Stomach:                Appears normal, left
                                                                       sided
 Cerebellum:            Appears normal         Abdomen:                Appears normal
 Posterior Fossa:       Appears normal         Abdominal Wall:         Appears nml (cord
                                                                       insert, abd wall)
 Nuchal Fold:           Appears normal         Cord Vessels:           Appears normal (3
                                                                       vessel cord)
 Face:                  Appears normal         Kidneys:                Appear normal
                        (orbits and profile)
 Lips:                  Not well visualized    Bladder:                Appears normal
 Thoracic:              Appears normal         Spine:                  Limited views
                                                                       appear normal
 Heart:                 Echogenic focus        Upper Extremities:      Appears normal
                        in LV
 RVOT:                  Appears normal         Lower Extremities:      Appears normal
 LVOT:                  Appears normal

 Other:  Parents do not wish to know sex of fetus. Technically difficult due to
         fetal position.
Cervix Uterus Adnexa

 Cervix
 Length:            3.7  cm.
 Normal appearance by transabdominal scan.

 Adnexa
 No abnormality visualized.
Impression

 We performed a fetal anatomy scan. An echogenic
 intracardiac focus is seen. No other markers of aneuploidies
 or fetal structural defects are seen. Fetal biometry is
 consistent with her previously-established dates. Amniotic
 fluid is normal and good fetal activity is seen. Patient
 understands the limitations of ultrasound in detecting fetal
 anomalies.

 Echogenic intracardiac focus: I counseled the patient that
 echogenic focus is seen in about 3% to 4% of normal fetuses
 (more in Asian population), and in about 15%-20% of fetuses
 with Down syndrome. She was reassured that echogenic
 focus is not associated with any structural heart
 malformations. Presence of this isolated marker only slightly
 increases the a priori risk for Down syndrome.

 I discussed the following options: 1) Maternal blood cell-free
 fetal DNA screening  for trisomies 21, 18 and 13, which has a
 greater detection rate than conventional screening tests. I
 informed the patient that not all chromosomal malformations
 are detected by this test. 2) I informed her that only
 amniocentesis will give a definitive result on the fetal
 karyotype. I discussed a procedure-related pregnancy loss of
 about 1 in 500.

 Patient would like to talk to her insurance provider and decide
 on cell-free fetal DNA screening. She opted not to have
 amniocentesis.
Recommendations

 An appointment was made for her to return in 4 weeks for
 completion of fetal anatomy.
                 [place], [place]

## 2023-02-05 ENCOUNTER — Ambulatory Visit
Admission: RE | Admit: 2023-02-05 | Discharge: 2023-02-05 | Disposition: A | Discharge status: Home / Self Care | Payer: No Typology Code available for payment source | Source: Ambulatory Visit | Attending: Internal Medicine | Admitting: Internal Medicine

## 2023-02-05 VITALS — BP 137/87 | HR 92 | Temp 97.9°F | Resp 18

## 2023-02-05 DIAGNOSIS — H6122 Impacted cerumen, left ear: Secondary | ICD-10-CM | POA: Diagnosis present

## 2023-02-05 DIAGNOSIS — Z113 Encounter for screening for infections with a predominantly sexual mode of transmission: Secondary | ICD-10-CM | POA: Diagnosis present

## 2023-02-05 LAB — POCT URINALYSIS DIP (MANUAL ENTRY)
Bilirubin, UA: NEGATIVE
Glucose, UA: NEGATIVE mg/dL
Ketones, POC UA: NEGATIVE mg/dL
Leukocytes, UA: NEGATIVE
Nitrite, UA: POSITIVE — AB
Protein Ur, POC: 30 mg/dL — AB
Spec Grav, UA: >=1.03 — AB (ref 1.010–1.025)
Urobilinogen, UA: 0.2 E.U./dL
pH, UA: 6 (ref 5.0–8.0)

## 2023-02-05 LAB — POCT URINE PREGNANCY: Preg Test, Ur: NEGATIVE

## 2023-02-05 NOTE — ED Triage Notes (Signed)
Pt states her husband is having penile discharge x 1- 2 weeks. Pt would like to be tested. Pt denies any symptoms at this time.

## 2023-02-05 NOTE — Discharge Instructions (Addendum)
STD testing is pending.  Will call if it is positive and treat as appropriate.   Ear has been washed out.  Please follow-up with any further concerns.

## 2023-02-05 NOTE — ED Provider Notes (Addendum)
[facility]    CSN: [id number] Arrival date & time: 02/05/23  1503      History   Chief Complaint Chief Complaint  Patient presents with   Urinary Frequency    UTI check - Entered by patient   Exposure to STD    HPI Kalynn Caldeira is a 32 y.o. female.   Patient presents for STD testing.  Patient reports that her husband has been having some penile discharge so she wants to be tested.  Although, she reports that he denies that he has been unfaithful or has had any exposure to STD.  She denies any associated symptoms including vaginal discharge, dysuria, urinary frequency, abdominal pain, pelvic pain, back pain, fever.  Last menstrual cycle was approximately 1 month ago but patient is not sure of exact date.  Patient would like urinalysis as well given that she has had UTI in the past with no symptoms.  Patient also reporting feelings of left ear fullness that has been present for a few days.  Reports that she did have a cold-like illness a few weeks prior that is now resolved so she is not sure if it is related.  Denies fever, trauma, foreign body, drainage from the ear.   Urinary Frequency  Exposure to STD    Past Medical History:  Diagnosis Date   Medical history non-contributory     Patient Active Problem List   Diagnosis Date Noted   SVD (spontaneous vaginal delivery) 11/09/2019   ROM (rupture of membranes), premature 11/08/2019   Positive GBS test 10/18/2019   Echogenic intracardiac focus of fetus on prenatal ultrasound 06/22/2019   Supervision of normal pregnancy 05/04/2019    Past Surgical History:  Procedure Laterality Date   NO PAST SURGERIES     WISDOM TOOTH EXTRACTION     WISDOM TOOTH EXTRACTION      OB History     Gravida  2   Para  2   Term  2   Preterm      AB      Living  2      SAB      IAB      Ectopic      Multiple  0   Live Births  2            Home Medications    Prior to Admission medications    Medication Sig Start Date End Date Taking? Authorizing Provider  acetaminophen (TYLENOL) 325 MG tablet Take 2 tablets (650 mg total) by mouth every 4 (four) hours as needed (for pain scale < 4). 11/10/19   Sparacino, Hailey L, DO  Prenatal Vit-Fe Fumarate-FA (MULTIVITAMIN-PRENATAL) 27-0.8 MG TABS tablet Take 1 tablet by mouth daily at 12 noon.    [provider]    Family History Family History  Problem Relation Age of Onset   Hypertension Mother    Thyroid disease Mother    Cancer Mother        throat cancer   Hypertension Father    Heart failure Father     Social History Social History   Tobacco Use   Smoking status: Never   Smokeless tobacco: Never  Vaping Use   Vaping Use: Never used  Substance Use Topics   Alcohol use: Not Currently    Comment: 1 drink occ   Drug use: No     Allergies   Patient has no known allergies.   Review of Systems Review of Systems Per HPI  Physical  Exam Triage Vital Signs ED Triage Vitals  Enc Vitals Group     BP 02/05/23 1522 137/87     Pulse Rate 02/05/23 1522 (!) 105     Resp 02/05/23 1522 18     Temp 02/05/23 1528 97.9 F (36.6 C)     Temp Source 02/05/23 1528 Oral     SpO2 02/05/23 1522 97 %     Weight --      Height --      Head Circumference --      Peak Flow --      Pain Score --      Pain Loc --      Pain Edu? --      Excl. in [place]? --    No data found.  Updated Vital Signs BP 137/87 (BP Location: Left Arm)   Pulse 92   Temp 97.9 F (36.6 C) (Oral)   Resp 18   LMP 01/04/2023   SpO2 97%   Visual Acuity Right Eye Distance:   Left Eye Distance:   Bilateral Distance:    Right Eye Near:   Left Eye Near:    Bilateral Near:     Physical Exam Constitutional:      General: She is not in acute distress.    Appearance: Normal appearance. She is not toxic-appearing or diaphoretic.  HENT:     Head: Normocephalic and atraumatic.     Left Ear: No swelling or tenderness.  No middle ear effusion. There  is impacted cerumen. Tympanic membrane is not perforated, erythematous or bulging.     Ears:     Comments: Impacted cerumen to left external canal.  Ear was irrigated successfully.  After second physical exam, TM and external canal appear normal. Eyes:     Extraocular Movements: Extraocular movements intact.     Conjunctiva/sclera: Conjunctivae normal.  Pulmonary:     Effort: Pulmonary effort is normal.  Genitourinary:    Comments: Deferred with shared decision making. Self swab performed.  Neurological:     General: No focal deficit present.     Mental Status: She is alert and oriented to person, place, and time. Mental status is at baseline.  Psychiatric:        Mood and Affect: Mood normal.        Behavior: Behavior normal.        Thought Content: Thought content normal.        Judgment: Judgment normal.      UC Treatments / Results  Labs (all labs ordered are listed, but only abnormal results are displayed) Labs Reviewed  POCT URINALYSIS DIP (MANUAL ENTRY) - Abnormal; Notable for the following components:      Result Value   Spec Grav, UA >=1.030 (*)    Blood, UA moderate (*)    Protein Ur, POC =30 (*)    Nitrite, UA Positive (*)    All other components within normal limits  POCT URINE PREGNANCY  CERVICOVAGINAL ANCILLARY ONLY    EKG   Radiology No results found.  Procedures Procedures (including critical care time)  Medications Ordered in UC Medications - No data to display  Initial Impression / Assessment and Plan / UC Course  I have reviewed the triage vital signs and the nursing notes.  Pertinent labs & imaging results that were available during my care of the patient were reviewed by me and considered in my medical decision making (see chart for details).     1.  STD testing Cervicovaginal  swab pending.  Given no confirmed exposure to STD, will await results for any treatment if necessary.  Patient declined HIV and syphilis testing.  Patient requested  UA given history of urinary tract infections where she is symptom-free.  UA not indicative of urinary tract infection.  No leuks noted. Positive for nitrites but this is most likely not related to infection and is incidental finding versus low PO intake given patient is asymptomatic.  Will defer urine culture.  Discussed with with Dr. Lanny Cramp, supervising physician who is agreeable.  2. Impacted Cerumen Patient had impacted cerumen of left ear on original exam, and ear was irrigated successfully with complete removal of cerumen.  On second physical exam, ear appears normal.  Advised to follow-up with any further concerns.  Patient verbalized understanding and was agreeable with plan.   Final Clinical Impressions(s) / UC Diagnoses   Final diagnoses:  Screening examination for venereal disease  Impacted cerumen, left ear     Discharge Instructions      STD testing is pending.  Will call if it is positive and treat as appropriate.   Ear has been washed out.  Please follow-up with any further concerns.     ED Prescriptions   None    PDMP not reviewed this encounter.   Teodora Medici, [place] 02/05/23 [place], [place], [place] 02/05/23 [phone]

## 2023-02-06 LAB — CERVICOVAGINAL ANCILLARY ONLY
Bacterial Vaginitis (gardnerella): POSITIVE — AB
Candida Glabrata: NEGATIVE
Candida Vaginitis: NEGATIVE
Chlamydia: NEGATIVE
Comment: NEGATIVE
Comment: NEGATIVE
Comment: NEGATIVE
Comment: NEGATIVE
Comment: NEGATIVE
Comment: NORMAL
Neisseria Gonorrhea: NEGATIVE
Trichomonas: NEGATIVE

## 2023-02-07 ENCOUNTER — Telehealth: Payer: Self-pay | Admitting: Emergency Medicine

## 2023-02-07 MED ORDER — METRONIDAZOLE 500 MG PO TABS: 500.0000 mg | ORAL_TABLET | Freq: Two times a day (BID) | ORAL | 0 refills | Status: AC
